# Patient Record
Sex: Male | Born: 1955 | Race: White | Hispanic: No | Smoking: Former smoker
Health system: Southern US, Community
[De-identification: ages and names within clinical notes are randomized; demographics above are authoritative.]

## PROBLEM LIST (undated history)

## (undated) DIAGNOSIS — M1990 Unspecified osteoarthritis, unspecified site: Secondary | ICD-10-CM

## (undated) DIAGNOSIS — K219 Gastro-esophageal reflux disease without esophagitis: Secondary | ICD-10-CM

## (undated) HISTORY — PX: OTHER SURGICAL HISTORY: SHX169

---

## 1999-09-22 ENCOUNTER — Other Ambulatory Visit (HOSPITAL_COMMUNITY): Admission: RE | Admit: 1999-09-22 | Discharge: 1999-10-13 | Payer: Self-pay | Admitting: Psychiatry

## 2003-11-27 ENCOUNTER — Ambulatory Visit (HOSPITAL_COMMUNITY): Admission: RE | Admit: 2003-11-27 | Discharge: 2003-11-27 | Payer: Self-pay | Admitting: Specialist

## 2004-01-30 ENCOUNTER — Ambulatory Visit (HOSPITAL_BASED_OUTPATIENT_CLINIC_OR_DEPARTMENT_OTHER): Admission: RE | Admit: 2004-01-30 | Discharge: 2004-01-30 | Payer: Self-pay | Admitting: Specialist

## 2005-07-21 ENCOUNTER — Ambulatory Visit (HOSPITAL_BASED_OUTPATIENT_CLINIC_OR_DEPARTMENT_OTHER): Admission: RE | Admit: 2005-07-21 | Discharge: 2005-07-21 | Payer: Self-pay | Admitting: Specialist

## 2014-11-19 ENCOUNTER — Other Ambulatory Visit: Payer: Self-pay | Admitting: Acute Care

## 2014-11-19 DIAGNOSIS — Z87891 Personal history of nicotine dependence: Secondary | ICD-10-CM

## 2014-11-22 ENCOUNTER — Ambulatory Visit (INDEPENDENT_AMBULATORY_CARE_PROVIDER_SITE_OTHER): Payer: Commercial Managed Care - HMO | Admitting: Acute Care

## 2014-11-22 ENCOUNTER — Ambulatory Visit
Admission: RE | Admit: 2014-11-22 | Discharge: 2014-11-22 | Disposition: A | Discharge status: Home / Self Care | Payer: Commercial Managed Care - HMO | Source: Ambulatory Visit | Attending: Acute Care | Admitting: Acute Care

## 2014-11-22 ENCOUNTER — Encounter: Payer: Self-pay | Admitting: Acute Care

## 2014-11-22 ENCOUNTER — Telehealth: Payer: Self-pay | Admitting: Acute Care

## 2014-11-22 DIAGNOSIS — Z87891 Personal history of nicotine dependence: Secondary | ICD-10-CM

## 2014-11-22 NOTE — Telephone Encounter (Signed)
I have called Mr. Bradley Harrell with the results of the low dose CT. I explained that it was read as a Lung RADs 2, nodules with a very low likelihood of becoming a clinically active cancer due to lack of size and growth. I explained that the recommendation is for a repeat scan in 12 months, and that we will call at the beginning of Oct. 2017 to schedule it for the end of Oct. 2017. I told him to call sooner if he notice blood in his sputum while coughing, or if he had any unexplained weight loss of >15 pounds in 6 months, as we would want to follow up sooner.He verbalized understanding of all of the above and had no further questions. He has my contact information in the event he has any further questions.I will fax a copy of the scan to Dr. Catha GosselinKevin Little, the patients PCP.

## 2014-11-22 NOTE — Progress Notes (Signed)
Shared Decision Making Visit Lung Cancer Screening Program [phone]([id number])   Eligibility:  Age 59 y.o.  Pack Years Smoking History Calculation 44 pack year history (# packs/per year x # years smoked)  Recent History of coughing up blood  no  Unexplained weight loss? no ( >Than 15 pounds within the last 6 months )  Prior History Lung / other cancer no (Diagnosis within the last 5 years already requiring surveillance chest CT Scans).  Smoking Status Former Smoker  Former Smokers: Years since quit: 6 years  Quit Date: 2010  Visit Components:  Discussion included one or more decision making aids. yes  Discussion included risk/benefits of screening. yes  Discussion included potential follow up diagnostic testing for abnormal scans. yes  Discussion included meaning and risk of over diagnosis. yes  Discussion included meaning and risk of False Positives. yes  Discussion included meaning of total radiation exposure. yes  Counseling Included:  Importance of adherence to annual lung cancer LDCT screening. yes  Impact of comorbidities on ability to participate in the program. yes  Ability and willingness to under diagnostic treatment. yes  Smoking Cessation Counseling:  Current Smokers: 9}  Discussed importance of smoking cessation. NA; former smoker  Information about tobacco cessation classes and interventions provided to patient. Former smoker  Patient provided with "ticket" for LDCT Scan. Former smoker  Symptomatic Patient. No  Counseling;NA  Diagnosis Code: Tobacco Use Z72.0  Asymptomatic Patient yes  CounselingNA  Former Smokers:   Discussed the importance of maintaining cigarette abstinence. yes  Diagnosis Code: Personal History of Nicotine Dependence. U04.540Z87.891  Information about tobacco cessation classes and interventions provided to patient. Yes  Patient provided with "ticket" for LDCT Scan. yes  Written Order for Lung Cancer Screening with LDCT placed  in Epic. Yes (CT Chest Lung Cancer Screening Low Dose W/O CM) [id number] Z12.2-Screening of respiratory organs Z87.891-Personal history of nicotine dependence  I spent 15 minutes of face to face time with Mr. Bradley Harrell discussing the risks and benefits of lung cancer screening. We viewed a power point that addressed the above noted topics, pausing at intervals to allow for questions to be asked and answered to ensure understanding. We discussed that by quitting smoking he took the single most powerful action he could to decrease his risk of lung cancer. He quit using Chantix 6 years ago and has no desire to smoke again. We discussed the time and location of his scan, and that I will call him within 12-24 hours of having the results to let him know the outcome. He verbalized understanding of all of the above.I gave him a copy of the power point we viewed together and my card and contact information in the event he has any additional questions in the future.    Bevelyn NgoSarah F Joseantonio Dittmar, NP

## 2015-04-16 ENCOUNTER — Other Ambulatory Visit: Payer: Self-pay | Admitting: Acute Care

## 2015-04-16 DIAGNOSIS — Z87891 Personal history of nicotine dependence: Secondary | ICD-10-CM

## 2015-11-25 ENCOUNTER — Ambulatory Visit
Admission: RE | Admit: 2015-11-25 | Discharge: 2015-11-25 | Disposition: A | Discharge status: Home / Self Care | Payer: Commercial Managed Care - HMO | Source: Ambulatory Visit | Attending: Acute Care | Admitting: Acute Care

## 2015-11-25 DIAGNOSIS — Z87891 Personal history of nicotine dependence: Secondary | ICD-10-CM

## 2015-11-27 ENCOUNTER — Other Ambulatory Visit: Payer: Self-pay | Admitting: Acute Care

## 2015-11-27 DIAGNOSIS — Z87891 Personal history of nicotine dependence: Secondary | ICD-10-CM

## 2016-02-22 DIAGNOSIS — J0100 Acute maxillary sinusitis, unspecified: Secondary | ICD-10-CM | POA: Diagnosis not present

## 2016-03-26 DIAGNOSIS — E782 Mixed hyperlipidemia: Secondary | ICD-10-CM | POA: Diagnosis not present

## 2016-03-26 DIAGNOSIS — Z0000 Encounter for general adult medical examination without abnormal findings: Secondary | ICD-10-CM | POA: Diagnosis not present

## 2016-05-06 DIAGNOSIS — Z23 Encounter for immunization: Secondary | ICD-10-CM | POA: Diagnosis not present

## 2016-09-02 DIAGNOSIS — Z79899 Other long term (current) drug therapy: Secondary | ICD-10-CM | POA: Diagnosis not present

## 2016-09-02 DIAGNOSIS — E782 Mixed hyperlipidemia: Secondary | ICD-10-CM | POA: Diagnosis not present

## 2016-09-23 DIAGNOSIS — Z23 Encounter for immunization: Secondary | ICD-10-CM | POA: Diagnosis not present

## 2016-11-25 ENCOUNTER — Ambulatory Visit
Admission: RE | Admit: 2016-11-25 | Discharge: 2016-11-25 | Disposition: A | Discharge status: Home / Self Care | Payer: 59 | Source: Ambulatory Visit | Attending: Acute Care | Admitting: Acute Care

## 2016-11-25 DIAGNOSIS — Z87891 Personal history of nicotine dependence: Secondary | ICD-10-CM | POA: Diagnosis not present

## 2016-12-01 ENCOUNTER — Other Ambulatory Visit: Payer: Self-pay | Admitting: Acute Care

## 2016-12-01 DIAGNOSIS — Z122 Encounter for screening for malignant neoplasm of respiratory organs: Secondary | ICD-10-CM

## 2016-12-01 DIAGNOSIS — Z87891 Personal history of nicotine dependence: Secondary | ICD-10-CM

## 2017-02-15 DIAGNOSIS — M1731 Unilateral post-traumatic osteoarthritis, right knee: Secondary | ICD-10-CM | POA: Diagnosis not present

## 2017-02-15 DIAGNOSIS — M25561 Pain in right knee: Secondary | ICD-10-CM | POA: Diagnosis not present

## 2017-03-06 DIAGNOSIS — J018 Other acute sinusitis: Secondary | ICD-10-CM | POA: Diagnosis not present

## 2017-03-24 DIAGNOSIS — M1711 Unilateral primary osteoarthritis, right knee: Secondary | ICD-10-CM | POA: Diagnosis not present

## 2017-03-31 DIAGNOSIS — M1711 Unilateral primary osteoarthritis, right knee: Secondary | ICD-10-CM | POA: Diagnosis not present

## 2017-04-07 DIAGNOSIS — M1711 Unilateral primary osteoarthritis, right knee: Secondary | ICD-10-CM | POA: Diagnosis not present

## 2017-05-21 DIAGNOSIS — Z0000 Encounter for general adult medical examination without abnormal findings: Secondary | ICD-10-CM | POA: Diagnosis not present

## 2017-05-21 DIAGNOSIS — E782 Mixed hyperlipidemia: Secondary | ICD-10-CM | POA: Diagnosis not present

## 2017-06-22 DIAGNOSIS — J329 Chronic sinusitis, unspecified: Secondary | ICD-10-CM | POA: Diagnosis not present

## 2017-09-18 DIAGNOSIS — J069 Acute upper respiratory infection, unspecified: Secondary | ICD-10-CM | POA: Diagnosis not present

## 2017-09-18 DIAGNOSIS — Z23 Encounter for immunization: Secondary | ICD-10-CM | POA: Diagnosis not present

## 2017-11-26 ENCOUNTER — Ambulatory Visit
Admission: RE | Admit: 2017-11-26 | Discharge: 2017-11-26 | Disposition: A | Discharge status: Home / Self Care | Payer: 59 | Source: Ambulatory Visit | Attending: Acute Care | Admitting: Acute Care

## 2017-11-26 DIAGNOSIS — Z87891 Personal history of nicotine dependence: Secondary | ICD-10-CM

## 2017-11-26 DIAGNOSIS — Z122 Encounter for screening for malignant neoplasm of respiratory organs: Secondary | ICD-10-CM

## 2017-12-03 ENCOUNTER — Other Ambulatory Visit: Payer: Self-pay | Admitting: Acute Care

## 2017-12-03 DIAGNOSIS — Z87891 Personal history of nicotine dependence: Secondary | ICD-10-CM

## 2017-12-03 DIAGNOSIS — Z122 Encounter for screening for malignant neoplasm of respiratory organs: Secondary | ICD-10-CM

## 2018-06-16 DIAGNOSIS — Z23 Encounter for immunization: Secondary | ICD-10-CM | POA: Diagnosis not present

## 2018-06-16 DIAGNOSIS — E782 Mixed hyperlipidemia: Secondary | ICD-10-CM | POA: Diagnosis not present

## 2018-06-16 DIAGNOSIS — Z0000 Encounter for general adult medical examination without abnormal findings: Secondary | ICD-10-CM | POA: Diagnosis not present

## 2018-12-01 ENCOUNTER — Ambulatory Visit
Admission: RE | Admit: 2018-12-01 | Discharge: 2018-12-01 | Disposition: A | Discharge status: Home / Self Care | Payer: 59 | Source: Ambulatory Visit | Attending: Acute Care | Admitting: Acute Care

## 2018-12-01 DIAGNOSIS — Z122 Encounter for screening for malignant neoplasm of respiratory organs: Secondary | ICD-10-CM

## 2018-12-01 DIAGNOSIS — Z87891 Personal history of nicotine dependence: Secondary | ICD-10-CM

## 2018-12-08 ENCOUNTER — Telehealth: Payer: Self-pay | Admitting: Acute Care

## 2018-12-08 DIAGNOSIS — Z122 Encounter for screening for malignant neoplasm of respiratory organs: Secondary | ICD-10-CM

## 2018-12-08 DIAGNOSIS — Z87891 Personal history of nicotine dependence: Secondary | ICD-10-CM

## 2018-12-08 NOTE — Telephone Encounter (Signed)
Pt informed of CT results per Sarah Groce, NP.  PT verbalized understanding.  Copy sent to PCP.  Order placed for 1 yr f/u CT.  

## 2019-09-08 ENCOUNTER — Other Ambulatory Visit: Payer: Self-pay | Admitting: Family Medicine

## 2019-09-08 DIAGNOSIS — Z136 Encounter for screening for cardiovascular disorders: Secondary | ICD-10-CM

## 2019-09-18 ENCOUNTER — Ambulatory Visit: Payer: 59

## 2019-09-19 ENCOUNTER — Ambulatory Visit: Payer: Self-pay | Admitting: Orthopedic Surgery

## 2019-09-25 ENCOUNTER — Ambulatory Visit
Admission: RE | Admit: 2019-09-25 | Discharge: 2019-09-25 | Disposition: A | Discharge status: Home / Self Care | Payer: Medicare Other | Source: Ambulatory Visit | Attending: Family Medicine | Admitting: Family Medicine

## 2019-09-25 DIAGNOSIS — Z136 Encounter for screening for cardiovascular disorders: Secondary | ICD-10-CM

## 2019-11-13 ENCOUNTER — Ambulatory Visit: Payer: Self-pay | Admitting: Orthopedic Surgery

## 2019-11-14 NOTE — Progress Notes (Signed)
DUE TO COVID-19 ONLY ONE VISITOR IS ALLOWED TO COME WITH YOU AND STAY IN THE WAITING ROOM ONLY DURING PRE OP AND PROCEDURE DAY OF SURGERY. THE 1 VISITOR  MAY VISIT WITH YOU AFTER SURGERY IN YOUR PRIVATE ROOM DURING VISITING HOURS ONLY!  YOU NEED TO HAVE A COVID 19 TEST ON___10/23/2021 ____ @_______ , THIS TEST MUST BE DONE BEFORE SURGERY,  COVID TESTING SITE [address] [place] , IT IS ON THE RIGHT GOING OUT [address] APPROXIMATELY  2 MINUTES PAST [employer] ON THE RIGHT. ONCE YOUR COVID TEST IS COMPLETED,  PLEASE BEGIN THE QUARANTINE INSTRUCTIONS AS OUTLINED IN YOUR HANDOUT.                Bradley Harrell  11/14/2019   Your procedure is scheduled on: 11/22/2019    Report to [facility] Main  Entrance   Report to admitting at   0530 AM     Call this number if you have problems the morning of surgery [phone]    REMEMBER: NO  SOLID FOOD CANDY OR GUM AFTER MIDNIGHT. CLEAR LIQUIDS UNTIL 0430 am         . NOTHING BY MOUTH EXCEPT CLEAR LIQUIDS UNTIL    . PLEASE FINISH ENSURE DRINK PER SURGEON ORDER  WHICH NEEDS TO BE COMPLETED AT   0430am   .      CLEAR LIQUID DIET   Foods Allowed                                                                    Coffee and tea, regular and decaf                            Fruit ices (not with fruit pulp)                                      Iced Popsicles                                    Carbonated beverages, regular and diet                                    Cranberry, grape and apple juices Sports drinks like Gatorade Lightly seasoned clear broth or consume(fat free) Sugar, honey syrup ___________________________________________________________________      BRUSH YOUR TEETH MORNING OF SURGERY AND RINSE YOUR MOUTH OUT, NO CHEWING GUM CANDY OR MINTS.     Take these medicines the morning of surgery with A SIP OF WATER: none                                  You may not have any metal on your  body including hair pins and              piercings  Do not wear jewelry, make-up, lotions, powders or perfumes, deodorant  Do not wear nail polish on your fingernails.  Do not shave  48 hours prior to surgery.              Men may shave face and neck.   Do not bring valuables to the hospital. [place].  Contacts, dentures or bridgework may not be worn into surgery.  Leave suitcase in the car. After surgery it may be brought to your room.     Patients discharged the day of surgery will not be allowed to drive home. IF YOU ARE HAVING SURGERY AND GOING HOME THE SAME DAY, YOU MUST HAVE AN ADULT TO DRIVE YOU HOME AND BE WITH YOU FOR 24 HOURS. YOU MAY GO HOME BY TAXI OR UBER OR ORTHERWISE, BUT AN ADULT MUST ACCOMPANY YOU HOME AND STAY WITH YOU FOR 24 HOURS.  Name and phone number of your driver:  Special Instructions: N/A              Please read over the following fact sheets you were given: _____________________________________________________________________  [facility] - Preparing for Surgery Before surgery, you can play an important role.  Because skin is not sterile, your skin needs to be as free of germs as possible.  You can reduce the number of germs on your skin by washing with CHG (chlorahexidine gluconate) soap before surgery.  CHG is an antiseptic cleaner which kills germs and bonds with the skin to continue killing germs even after washing. Please DO NOT use if you have an allergy to CHG or antibacterial soaps.  If your skin becomes reddened/irritated stop using the CHG and inform your nurse when you arrive at Short Stay. Do not shave (including legs and underarms) for at least 48 hours prior to the first CHG shower.  You may shave your face/neck. Please follow these instructions carefully:  1.  Shower with CHG Soap the night before surgery and the  morning of Surgery.  2.  If you choose to wash your hair, wash your hair  first as usual with your  normal  shampoo.  3.  After you shampoo, rinse your hair and body thoroughly to remove the  shampoo.                           4.  Use CHG as you would any other liquid soap.  You can apply chg directly  to the skin and wash                       Gently with a scrungie or clean washcloth.  5.  Apply the CHG Soap to your body ONLY FROM THE NECK DOWN.   Do not use on face/ open                           Wound or open sores. Avoid contact with eyes, ears mouth and genitals (private parts).                       Wash face,  Genitals (private parts) with your normal soap.             6.  Wash thoroughly, paying special attention to the area where your surgery  will be performed.  7.  Thoroughly rinse your body with  warm water from the neck down.  8.  DO NOT shower/wash with your normal soap after using and rinsing off  the CHG Soap.                9.  Pat yourself dry with a clean towel.            10.  Wear clean pajamas.            11.  Place clean sheets on your bed the night of your first shower and do not  sleep with pets. Day of Surgery : Do not apply any lotions/deodorants the morning of surgery.  Please wear clean clothes to the hospital/surgery center.  FAILURE TO FOLLOW THESE INSTRUCTIONS MAY RESULT IN THE CANCELLATION OF YOUR SURGERY PATIENT SIGNATURE_________________________________  NURSE SIGNATURE__________________________________  ________________________________________________________________________

## 2019-11-16 ENCOUNTER — Other Ambulatory Visit: Payer: Self-pay

## 2019-11-16 ENCOUNTER — Encounter (HOSPITAL_COMMUNITY)
Admission: RE | Admit: 2019-11-16 | Discharge: 2019-11-16 | Disposition: A | Discharge status: Home / Self Care | Payer: Medicare Other | Source: Ambulatory Visit | Attending: Specialist | Admitting: Specialist

## 2019-11-16 ENCOUNTER — Encounter (HOSPITAL_COMMUNITY): Payer: Self-pay

## 2019-11-16 ENCOUNTER — Ambulatory Visit: Payer: Self-pay | Admitting: Orthopedic Surgery

## 2019-11-16 DIAGNOSIS — Z01812 Encounter for preprocedural laboratory examination: Secondary | ICD-10-CM | POA: Diagnosis not present

## 2019-11-16 HISTORY — DX: Gastro-esophageal reflux disease without esophagitis: K21.9

## 2019-11-16 HISTORY — DX: Unspecified osteoarthritis, unspecified site: M19.90

## 2019-11-16 LAB — CBC
HCT: 43 % (ref 39.0–52.0)
Hemoglobin: 14.5 g/dL (ref 13.0–17.0)
MCH: 31 pg (ref 26.0–34.0)
MCHC: 33.7 g/dL (ref 30.0–36.0)
MCV: 91.9 fL (ref 80.0–100.0)
Platelets: 344 10*3/uL (ref 150–400)
RBC: 4.68 MIL/uL (ref 4.22–5.81)
RDW: 12.2 % (ref 11.5–15.5)
WBC: 8.8 10*3/uL (ref 4.0–10.5)
nRBC: 0 % (ref 0.0–0.2)

## 2019-11-16 LAB — SURGICAL PCR SCREEN
MRSA, PCR: NEGATIVE
Staphylococcus aureus: NEGATIVE

## 2019-11-16 LAB — URINALYSIS, ROUTINE W REFLEX MICROSCOPIC
Bilirubin Urine: NEGATIVE
Glucose, UA: NEGATIVE mg/dL
Ketones, ur: NEGATIVE mg/dL
Leukocytes,Ua: NEGATIVE
Nitrite: NEGATIVE
Protein, ur: NEGATIVE mg/dL
Specific Gravity, Urine: 1.01 (ref 1.005–1.030)
pH: 6.5 (ref 5.0–8.0)

## 2019-11-16 LAB — PROTIME-INR
INR: 0.9 (ref 0.8–1.2)
Prothrombin Time: 12.2 seconds (ref 11.4–15.2)

## 2019-11-16 LAB — BASIC METABOLIC PANEL
Anion gap: 11 (ref 5–15)
BUN: 12 mg/dL (ref 8–23)
CO2: 24 mmol/L (ref 22–32)
Calcium: 9.1 mg/dL (ref 8.9–10.3)
Chloride: 103 mmol/L (ref 98–111)
Creatinine, Ser: 0.89 mg/dL (ref 0.61–1.24)
GFR, Estimated: >60 mL/min (ref >60)
Glucose, Bld: 102 mg/dL — ABNORMAL HIGH (ref 70–99)
Potassium: 4.5 mmol/L (ref 3.5–5.1)
Sodium: 138 mmol/L (ref 135–145)

## 2019-11-16 LAB — APTT: aPTT: 30 seconds (ref 24–36)

## 2019-11-16 LAB — URINALYSIS, MICROSCOPIC (REFLEX)

## 2019-11-16 NOTE — Progress Notes (Addendum)
U/A done 11/16/19 routed via epic to Dr Shelle Iron.  Urine micro- routed via epic done 11/16/19.

## 2019-11-16 NOTE — H&P (View-Only) (Signed)
Bradley Harrell is an 64 y.o. male.   Chief Complaint: R knee pain HPI: Bradley Harrell is here today for his history and physical. He is scheduled for a right total knee arthroplasty on November 22, 2019 at [facility] by Dr. Jene Every.  Dr. Shelle Iron and the patient mutually agreed to proceed with a right total knee replacement. Risks and benefits of the procedure were discussed including stiffness, suboptimal range of motion, persistent pain, infection requiring removal of prosthesis and reinsertion, need for prophylactic antibiotics in the future, for example, dental procedures, possible need for manipulation, revision in the future and also anesthetic complications including DVT, PE, etc. We discussed the perioperative course, time in the hospital, postoperative recovery and the need for elevation to control swelling. We also discussed the predicted range of motion and the probability that squatting and kneeling would be unobtainable in the future. In addition, postoperative anticoagulation was discussed. We have obtained preoperative medical clearance as necessary from his PCP Dr. Clarene Duke. Provided illustrated handout and discussed it in detail. They will enroll in the total joint replacement educational forum at the hospital.  Past Medical History:  Diagnosis Date  . Arthritis   . GERD (gastroesophageal reflux disease)     Past Surgical History:  Procedure Laterality Date  . left knee arthroscopy     . right rotator cuff surgery       No family history on file. Social History:  reports that he has quit smoking. His smoking use included cigarettes. He has a 44.00 pack-year smoking history. He has never used smokeless tobacco. He reports that he does not drink alcohol and does not use drugs.  Allergies:  Allergies  Allergen Reactions  . Cefaclor Rash and Other (See Comments)    Skin irritation.   Medications: atorvastatin 20 mg tablet meloxicam 15 mg tablet Pennsaid 20  mg/gram/actuation (2 %) topical soln in metered-dose pump  Results for orders placed or performed during the hospital encounter of 11/16/19 (from the past 48 hour(s))  APTT     Status: None   Collection Time: 11/16/19 11:33 AM  Result Value Ref Range   aPTT 30 24 - 36 seconds    Comment: Performed at [facility], [address] [place], [place] [address]  CBC     Status: None   Collection Time: 11/16/19 11:33 AM  Result Value Ref Range   WBC 8.8 4.0 - 10.5 K/uL   RBC 4.68 4.22 - 5.81 MIL/uL   Hemoglobin 14.5 13.0 - 17.0 g/dL   HCT 45.6 39 - 52 %   MCV 91.9 80.0 - 100.0 fL   MCH 31.0 26.0 - 34.0 pg   MCHC 33.7 30.0 - 36.0 g/dL   RDW 25.6 38.9 - 37.3 %   Platelets 344 150 - 400 K/uL   nRBC 0.0 0.0 - 0.2 %    Comment: Performed at [facility], [address] [place], [place] [address]  Protime-INR     Status: None   Collection Time: 11/16/19 11:33 AM  Result Value Ref Range   Prothrombin Time 12.2 11.4 - 15.2 seconds   INR 0.9 0.8 - 1.2    Comment: (NOTE) INR goal varies based on device and disease states. Performed at [facility], [address], [place], [place] [address]    No results found.  Review of Systems  Constitutional: Negative.   HENT: Negative.   Eyes: Negative.   Respiratory: Negative.   Cardiovascular: Negative.  Gastrointestinal: Negative.   Endocrine: Negative.   Genitourinary: Negative.   Musculoskeletal: Positive for arthralgias and joint swelling.  Neurological: Negative.   Psychiatric/Behavioral: Negative.     There were no vitals taken for this visit. Physical Exam Constitutional:      Appearance: Normal appearance.  HENT:     Head: Normocephalic.     Right Ear: External ear normal.     Left Ear: External ear normal.     Nose: Nose normal.     Mouth/Throat:     Mouth: Mucous membranes are moist.  Cardiovascular:     Rate and Rhythm: Normal rate and regular rhythm.      Pulses: Normal pulses.  Pulmonary:     Effort: Pulmonary effort is normal.  Musculoskeletal:     Cervical back: Normal range of motion.     Comments: Patient is a 64 year old male.  Constitutional: General Appearance: healthy-appearing and NAD.  Gait and Station: Appearance: antalgic gait.  Cardiovascular System: Arterial Pulses Right: femoral normal, popliteal normal, dorsalis pedis normal, and posterior tibialis normal. Edema Right: no edema. Varicosities Right: no varicosities. Varicosities Left: no varicosities and capillary refill test normal.  Lymph Nodes: Inspection/Palpation Right: no inguinal LAD.  Knees: Inspection Right: no deformity. Bony Palpation Right: no tenderness of the inferior pole patella, the superior pole patella, the tibial tubercle, the lateral joint line, the medial tibial plateau, Gerdy's tubercle, or the neck of fibula and tenderness of the medial joint line. Soft Tissue Palpation Right: no tenderness of the quadriceps tendon, the prepatellar bursa, the patellar tendon, the medial collateral ligament, the saphenous nerve, the lateral collateral ligament, the infrapatellar tendon, or the common peroneal nerve. Active Range of Motion Right: normal. Stability Right: no laxity or ligamentous instability and anterior drawer sign negative, posterior drawer sign negative, and Lachman test negative. Special Tests Right: McMurray's test negative. Strength Right: no hamstring weakness or quadriceps weakness and flexion 5/5 and extension 5/5.  Skin: Right Lower Extremity: normal.  Neurologic: Ankle Reflex Right: normal (2). Knee Reflex Right: normal (2). Sensation on the Right: L2 normal, L3 normal, L4 normal, L5 normal, and S1 normal.  Psychiatric: Mood and Affect: active and alert and normal mood.  Skin:    General: Skin is warm and dry.  Neurological:     Mental Status: He is alert.      Assessment/Plan Impression: Right knee end-stage osteoarthritis refractory to  conservative treatment including injection therapy, quad strengthening, physical therapy, medication. Pain interfering with ADLs and quality of life at this point and patient desires to proceed with total knee replacement  Plan:Pt with end-stage right knee DJD, bone-on-bone, refractory to conservative tx, scheduled for 8 total knee replacement by Dr. Shelle Iron on October 27. We again discussed the procedure itself as well as risks, complications and alternatives, including but not limited to DVT, PE, infx, bleeding, failure of procedure, need for secondary procedure including manipulation, nerve injury, ongoing pain/symptoms, anesthesia risk, even stroke or death. Also discussed typical post-op protocols, activity restrictions, need for PT, flexion/extension exercises, time out of work. Discussed need for DVT ppx post-op per protocol. Discussed dental ppx and infx prevention. Also discussed limitations post-operatively such as kneeling and squatting. All questions were answered. Patient desires to proceed with surgery as scheduled.  Will hold supplements, ASA and NSAIDs accordingly. Will remain NPO after midnight the night before surgery. Will present to [facility] for pre-op testing. Given his rash and welts he experienced with Ceclor would recommend Vanco perioperatively. Anticipate hospital stay to  include at least 2 midnights given medical history and to ensure proper pain control. Plan aspirin for DVT ppx post-op. Plan oxycodone, Robaxin, Colace, Miralax. Plan home with HHPT post-op with family members at home for assistance then transition to outpatient PT. Will follow up 10-14 days post-op for staple removal and xrays.  Plan R total knee replacement  Dorothy Spark, PA-C for Dr. Shelle Iron 11/16/2019, 12:51 PM

## 2019-11-16 NOTE — H&P (Signed)
Bradley Harrell is an 64 y.o. male.   Chief Complaint: R knee pain HPI: Bradley Harrell is here today for his history and physical. He is scheduled for a right total knee arthroplasty on November 22, 2019 at [facility] by Dr. Jene Every.  Dr. Shelle Iron and the patient mutually agreed to proceed with a right total knee replacement. Risks and benefits of the procedure were discussed including stiffness, suboptimal range of motion, persistent pain, infection requiring removal of prosthesis and reinsertion, need for prophylactic antibiotics in the future, for example, dental procedures, possible need for manipulation, revision in the future and also anesthetic complications including DVT, PE, etc. We discussed the perioperative course, time in the hospital, postoperative recovery and the need for elevation to control swelling. We also discussed the predicted range of motion and the probability that squatting and kneeling would be unobtainable in the future. In addition, postoperative anticoagulation was discussed. We have obtained preoperative medical clearance as necessary from his PCP Dr. Clarene Duke. Provided illustrated handout and discussed it in detail. They will enroll in the total joint replacement educational forum at the hospital.  Past Medical History:  Diagnosis Date  . Arthritis   . GERD (gastroesophageal reflux disease)     Past Surgical History:  Procedure Laterality Date  . left knee arthroscopy     . right rotator cuff surgery       No family history on file. Social History:  reports that he has quit smoking. His smoking use included cigarettes. He has a 44.00 pack-year smoking history. He has never used smokeless tobacco. He reports that he does not drink alcohol and does not use drugs.  Allergies:  Allergies  Allergen Reactions  . Cefaclor Rash and Other (See Comments)    Skin irritation.   Medications: atorvastatin 20 mg tablet meloxicam 15 mg tablet Pennsaid 20  mg/gram/actuation (2 %) topical soln in metered-dose pump  Results for orders placed or performed during the hospital encounter of 11/16/19 (from the past 48 hour(s))  APTT     Status: None   Collection Time: 11/16/19 11:33 AM  Result Value Ref Range   aPTT 30 24 - 36 seconds    Comment: Performed at [facility], [address] [place], [place] [address]  CBC     Status: None   Collection Time: 11/16/19 11:33 AM  Result Value Ref Range   WBC 8.8 4.0 - 10.5 K/uL   RBC 4.68 4.22 - 5.81 MIL/uL   Hemoglobin 14.5 13.0 - 17.0 g/dL   HCT 45.6 39 - 52 %   MCV 91.9 80.0 - 100.0 fL   MCH 31.0 26.0 - 34.0 pg   MCHC 33.7 30.0 - 36.0 g/dL   RDW 25.6 38.9 - 37.3 %   Platelets 344 150 - 400 K/uL   nRBC 0.0 0.0 - 0.2 %    Comment: Performed at [facility], [address] [place], [place] [address]  Protime-INR     Status: None   Collection Time: 11/16/19 11:33 AM  Result Value Ref Range   Prothrombin Time 12.2 11.4 - 15.2 seconds   INR 0.9 0.8 - 1.2    Comment: (NOTE) INR goal varies based on device and disease states. Performed at [facility], [address], [place], [place] [address]    No results found.  Review of Systems  Constitutional: Negative.   HENT: Negative.   Eyes: Negative.   Respiratory: Negative.   Cardiovascular: Negative.  Gastrointestinal: Negative.   Endocrine: Negative.   Genitourinary: Negative.   Musculoskeletal: Positive for arthralgias and joint swelling.  Neurological: Negative.   Psychiatric/Behavioral: Negative.     There were no vitals taken for this visit. Physical Exam Constitutional:      Appearance: Normal appearance.  HENT:     Head: Normocephalic.     Right Ear: External ear normal.     Left Ear: External ear normal.     Nose: Nose normal.     Mouth/Throat:     Mouth: Mucous membranes are moist.  Cardiovascular:     Rate and Rhythm: Normal rate and regular rhythm.      Pulses: Normal pulses.  Pulmonary:     Effort: Pulmonary effort is normal.  Musculoskeletal:     Cervical back: Normal range of motion.     Comments: Patient is a 64 year old male.  Constitutional: General Appearance: healthy-appearing and NAD.  Gait and Station: Appearance: antalgic gait.  Cardiovascular System: Arterial Pulses Right: femoral normal, popliteal normal, dorsalis pedis normal, and posterior tibialis normal. Edema Right: no edema. Varicosities Right: no varicosities. Varicosities Left: no varicosities and capillary refill test normal.  Lymph Nodes: Inspection/Palpation Right: no inguinal LAD.  Knees: Inspection Right: no deformity. Bony Palpation Right: no tenderness of the inferior pole patella, the superior pole patella, the tibial tubercle, the lateral joint line, the medial tibial plateau, Gerdy's tubercle, or the neck of fibula and tenderness of the medial joint line. Soft Tissue Palpation Right: no tenderness of the quadriceps tendon, the prepatellar bursa, the patellar tendon, the medial collateral ligament, the saphenous nerve, the lateral collateral ligament, the infrapatellar tendon, or the common peroneal nerve. Active Range of Motion Right: normal. Stability Right: no laxity or ligamentous instability and anterior drawer sign negative, posterior drawer sign negative, and Lachman test negative. Special Tests Right: McMurray's test negative. Strength Right: no hamstring weakness or quadriceps weakness and flexion 5/5 and extension 5/5.  Skin: Right Lower Extremity: normal.  Neurologic: Ankle Reflex Right: normal (2). Knee Reflex Right: normal (2). Sensation on the Right: L2 normal, L3 normal, L4 normal, L5 normal, and S1 normal.  Psychiatric: Mood and Affect: active and alert and normal mood.  Skin:    General: Skin is warm and dry.  Neurological:     Mental Status: He is alert.      Assessment/Plan Impression: Right knee end-stage osteoarthritis refractory to  conservative treatment including injection therapy, quad strengthening, physical therapy, medication. Pain interfering with ADLs and quality of life at this point and patient desires to proceed with total knee replacement  Plan:Pt with end-stage right knee DJD, bone-on-bone, refractory to conservative tx, scheduled for 8 total knee replacement by Dr. Shelle Iron on October 27. We again discussed the procedure itself as well as risks, complications and alternatives, including but not limited to DVT, PE, infx, bleeding, failure of procedure, need for secondary procedure including manipulation, nerve injury, ongoing pain/symptoms, anesthesia risk, even stroke or death. Also discussed typical post-op protocols, activity restrictions, need for PT, flexion/extension exercises, time out of work. Discussed need for DVT ppx post-op per protocol. Discussed dental ppx and infx prevention. Also discussed limitations post-operatively such as kneeling and squatting. All questions were answered. Patient desires to proceed with surgery as scheduled.  Will hold supplements, ASA and NSAIDs accordingly. Will remain NPO after midnight the night before surgery. Will present to [facility] for pre-op testing. Given his rash and welts he experienced with Ceclor would recommend Vanco perioperatively. Anticipate hospital stay to  include at least 2 midnights given medical history and to ensure proper pain control. Plan aspirin for DVT ppx post-op. Plan oxycodone, Robaxin, Colace, Miralax. Plan home with HHPT post-op with family members at home for assistance then transition to outpatient PT. Will follow up 10-14 days post-op for staple removal and xrays.  Plan R total knee replacement  Dorothy Spark, PA-C for Dr. Shelle Iron 11/16/2019, 12:51 PM

## 2019-11-18 ENCOUNTER — Other Ambulatory Visit (HOSPITAL_COMMUNITY)
Admission: RE | Admit: 2019-11-18 | Discharge: 2019-11-18 | Disposition: A | Discharge status: Home / Self Care | Payer: Medicare Other | Source: Ambulatory Visit | Attending: Specialist | Admitting: Specialist

## 2019-11-18 DIAGNOSIS — Z01812 Encounter for preprocedural laboratory examination: Secondary | ICD-10-CM | POA: Diagnosis present

## 2019-11-18 DIAGNOSIS — Z20822 Contact with and (suspected) exposure to covid-19: Secondary | ICD-10-CM | POA: Insufficient documentation

## 2019-11-18 LAB — SARS CORONAVIRUS 2 (TAT 6-24 HRS): SARS Coronavirus 2: NEGATIVE

## 2019-11-21 MED ORDER — VANCOMYCIN HCL 1500 MG/300ML IV SOLN
1500.0000 mg | INTRAVENOUS | Status: AC
  Administered 2019-11-22: 1500 mg via INTRAVENOUS
  Filled 2019-11-21: qty 300

## 2019-11-22 ENCOUNTER — Encounter (HOSPITAL_COMMUNITY)
Admission: RE | Disposition: A | Discharge status: Home Health | Payer: Self-pay | Source: Home / Self Care | Attending: Specialist

## 2019-11-22 ENCOUNTER — Encounter (HOSPITAL_COMMUNITY): Payer: Self-pay | Admitting: Specialist

## 2019-11-22 ENCOUNTER — Inpatient Hospital Stay (HOSPITAL_COMMUNITY): Payer: Medicare Other | Admitting: Anesthesiology

## 2019-11-22 ENCOUNTER — Other Ambulatory Visit: Payer: Self-pay

## 2019-11-22 ENCOUNTER — Inpatient Hospital Stay (HOSPITAL_COMMUNITY)
Admission: RE | Admit: 2019-11-22 | Discharge: 2019-11-23 | DRG: 470 | Disposition: A | Discharge status: Home Health | Payer: Medicare Other | Attending: Specialist | Admitting: Specialist

## 2019-11-22 ENCOUNTER — Inpatient Hospital Stay (HOSPITAL_COMMUNITY): Payer: Medicare Other

## 2019-11-22 DIAGNOSIS — Z881 Allergy status to other antibiotic agents status: Secondary | ICD-10-CM | POA: Diagnosis not present

## 2019-11-22 DIAGNOSIS — Z87891 Personal history of nicotine dependence: Secondary | ICD-10-CM

## 2019-11-22 DIAGNOSIS — K219 Gastro-esophageal reflux disease without esophagitis: Secondary | ICD-10-CM | POA: Diagnosis present

## 2019-11-22 DIAGNOSIS — M1711 Unilateral primary osteoarthritis, right knee: Principal | ICD-10-CM | POA: Diagnosis present

## 2019-11-22 DIAGNOSIS — Z96659 Presence of unspecified artificial knee joint: Secondary | ICD-10-CM

## 2019-11-22 DIAGNOSIS — M21161 Varus deformity, not elsewhere classified, right knee: Secondary | ICD-10-CM | POA: Diagnosis present

## 2019-11-22 HISTORY — PX: TOTAL KNEE ARTHROPLASTY: SHX125

## 2019-11-22 SURGERY — ARTHROPLASTY, KNEE, TOTAL
Anesthesia: Monitor Anesthesia Care | Site: Knee | Laterality: Right

## 2019-11-22 MED ORDER — ONDANSETRON HCL 4 MG/2ML IJ SOLN: 4.0000 mg | Freq: Once | INTRAMUSCULAR | Status: DC | PRN

## 2019-11-22 MED ORDER — MIDAZOLAM HCL 2 MG/2ML IJ SOLN
INTRAMUSCULAR | Status: AC
  Filled 2019-11-22: qty 2

## 2019-11-22 MED ORDER — IRRISEPT - 450ML BOTTLE WITH 0.05% CHG IN STERILE WATER, USP 99.95% OPTIME
TOPICAL | Status: DC | PRN
  Administered 2019-11-22: 150 mL

## 2019-11-22 MED ORDER — DOCUSATE SODIUM 100 MG PO CAPS
100.0000 mg | ORAL_CAPSULE | Freq: Two times a day (BID) | ORAL | Status: DC
  Administered 2019-11-22 – 2019-11-23 (×2): 100 mg via ORAL
  Filled 2019-11-22 (×2): qty 1

## 2019-11-22 MED ORDER — SODIUM CHLORIDE 0.9 % IR SOLN
Status: DC | PRN
  Administered 2019-11-22: 1000 mL

## 2019-11-22 MED ORDER — PHENYLEPHRINE HCL-NACL 10-0.9 MG/250ML-% IV SOLN
INTRAVENOUS | Status: AC
  Filled 2019-11-22: qty 250

## 2019-11-22 MED ORDER — PHENOL 1.4 % MT LIQD: 1.0000 | OROMUCOSAL | Status: DC | PRN

## 2019-11-22 MED ORDER — PHENYLEPHRINE HCL-NACL 10-0.9 MG/250ML-% IV SOLN
INTRAVENOUS | Status: DC | PRN
  Administered 2019-11-22: 25 ug/min via INTRAVENOUS

## 2019-11-22 MED ORDER — ONDANSETRON HCL 4 MG/2ML IJ SOLN
INTRAMUSCULAR | Status: AC
  Filled 2019-11-22: qty 2

## 2019-11-22 MED ORDER — BUPIVACAINE IN DEXTROSE 0.75-8.25 % IT SOLN
INTRATHECAL | Status: DC | PRN
  Administered 2019-11-22: 2 mL via INTRATHECAL

## 2019-11-22 MED ORDER — HYDROMORPHONE HCL 1 MG/ML IJ SOLN: 0.2500 mg | INTRAMUSCULAR | Status: DC | PRN

## 2019-11-22 MED ORDER — DIPHENHYDRAMINE HCL 12.5 MG/5ML PO ELIX: 12.5000 mg | ORAL_SOLUTION | ORAL | Status: DC | PRN

## 2019-11-22 MED ORDER — ONDANSETRON HCL 4 MG/2ML IJ SOLN
INTRAMUSCULAR | Status: DC | PRN
  Administered 2019-11-22: 4 mg via INTRAVENOUS

## 2019-11-22 MED ORDER — HYDROMORPHONE HCL 1 MG/ML IJ SOLN: 1.0000 mg | INTRAMUSCULAR | Status: DC | PRN

## 2019-11-22 MED ORDER — ONDANSETRON HCL 4 MG PO TABS: 4.0000 mg | ORAL_TABLET | Freq: Four times a day (QID) | ORAL | Status: DC | PRN

## 2019-11-22 MED ORDER — KCL IN DEXTROSE-NACL 20-5-0.45 MEQ/L-%-% IV SOLN
INTRAVENOUS | Status: AC
  Filled 2019-11-22 (×2): qty 1000

## 2019-11-22 MED ORDER — BISACODYL 5 MG PO TBEC: 5.0000 mg | DELAYED_RELEASE_TABLET | Freq: Every day | ORAL | Status: DC | PRN

## 2019-11-22 MED ORDER — STERILE WATER FOR IRRIGATION IR SOLN
Status: DC | PRN
  Administered 2019-11-22: 2000 mL

## 2019-11-22 MED ORDER — FENTANYL CITRATE (PF) 100 MCG/2ML IJ SOLN
INTRAMUSCULAR | Status: DC | PRN
  Administered 2019-11-22: 50 ug via INTRAVENOUS
  Administered 2019-11-22 (×2): 25 ug via INTRAVENOUS

## 2019-11-22 MED ORDER — OXYCODONE HCL 5 MG/5ML PO SOLN: 5.0000 mg | Freq: Once | ORAL | Status: DC | PRN

## 2019-11-22 MED ORDER — LACTATED RINGERS IV SOLN: INTRAVENOUS | Status: DC

## 2019-11-22 MED ORDER — ASCORBIC ACID 500 MG PO TABS
500.0000 mg | ORAL_TABLET | Freq: Two times a day (BID) | ORAL | Status: DC
  Administered 2019-11-22 – 2019-11-23 (×2): 500 mg via ORAL
  Filled 2019-11-22 (×2): qty 1

## 2019-11-22 MED ORDER — PROPOFOL 10 MG/ML IV BOLUS
INTRAVENOUS | Status: DC | PRN
  Administered 2019-11-22: 20 mg via INTRAVENOUS
  Administered 2019-11-22: 10 mg via INTRAVENOUS

## 2019-11-22 MED ORDER — PROPOFOL 1000 MG/100ML IV EMUL
INTRAVENOUS | Status: AC
  Filled 2019-11-22: qty 100

## 2019-11-22 MED ORDER — ONDANSETRON HCL 4 MG/2ML IJ SOLN: 4.0000 mg | Freq: Four times a day (QID) | INTRAMUSCULAR | Status: DC | PRN

## 2019-11-22 MED ORDER — ROPIVACAINE HCL 5 MG/ML IJ SOLN
INTRAMUSCULAR | Status: DC | PRN
  Administered 2019-11-22: 30 mL via PERINEURAL

## 2019-11-22 MED ORDER — MIDAZOLAM HCL 5 MG/5ML IJ SOLN
INTRAMUSCULAR | Status: DC | PRN
  Administered 2019-11-22 (×2): 1 mg via INTRAVENOUS

## 2019-11-22 MED ORDER — LIDOCAINE 2% (20 MG/ML) 5 ML SYRINGE
INTRAMUSCULAR | Status: AC
  Filled 2019-11-22: qty 5

## 2019-11-22 MED ORDER — MENTHOL 3 MG MT LOZG: 1.0000 | LOZENGE | OROMUCOSAL | Status: DC | PRN

## 2019-11-22 MED ORDER — BUPIVACAINE-EPINEPHRINE 0.25% -1:200000 IJ SOLN
INTRAMUSCULAR | Status: AC
  Filled 2019-11-22: qty 1

## 2019-11-22 MED ORDER — 0.9 % SODIUM CHLORIDE (POUR BTL) OPTIME
TOPICAL | Status: DC | PRN
  Administered 2019-11-22: 1000 mL

## 2019-11-22 MED ORDER — DEXAMETHASONE SODIUM PHOSPHATE 10 MG/ML IJ SOLN
INTRAMUSCULAR | Status: AC
  Filled 2019-11-22: qty 1

## 2019-11-22 MED ORDER — VITAMIN B-12 1000 MCG PO TABS
1000.0000 ug | ORAL_TABLET | Freq: Every day | ORAL | Status: DC
  Administered 2019-11-22 – 2019-11-23 (×2): 1000 ug via ORAL
  Filled 2019-11-22 (×2): qty 1

## 2019-11-22 MED ORDER — MAGNESIUM CITRATE PO SOLN: 1.0000 | Freq: Once | ORAL | Status: DC | PRN

## 2019-11-22 MED ORDER — OMEGA-3-ACID ETHYL ESTERS 1 G PO CAPS
1.0000 g | ORAL_CAPSULE | Freq: Every day | ORAL | Status: DC
  Administered 2019-11-22 – 2019-11-23 (×2): 1 g via ORAL
  Filled 2019-11-22 (×2): qty 1

## 2019-11-22 MED ORDER — ACETAMINOPHEN 10 MG/ML IV SOLN
1000.0000 mg | INTRAVENOUS | Status: AC
  Administered 2019-11-22: 1000 mg via INTRAVENOUS
  Filled 2019-11-22: qty 100

## 2019-11-22 MED ORDER — CHLORHEXIDINE GLUCONATE CLOTH 2 % EX PADS: 6.0000 | MEDICATED_PAD | Freq: Every day | CUTANEOUS | Status: DC

## 2019-11-22 MED ORDER — ACETAMINOPHEN 10 MG/ML IV SOLN: 1000.0000 mg | Freq: Once | INTRAVENOUS | Status: DC | PRN

## 2019-11-22 MED ORDER — FENTANYL CITRATE (PF) 100 MCG/2ML IJ SOLN
INTRAMUSCULAR | Status: AC
  Filled 2019-11-22: qty 2

## 2019-11-22 MED ORDER — RISAQUAD PO CAPS
1.0000 | ORAL_CAPSULE | Freq: Every day | ORAL | Status: DC
  Administered 2019-11-22 – 2019-11-23 (×2): 1 via ORAL
  Filled 2019-11-22 (×2): qty 1

## 2019-11-22 MED ORDER — BUPIVACAINE-EPINEPHRINE 0.25% -1:200000 IJ SOLN
INTRAMUSCULAR | Status: DC | PRN
  Administered 2019-11-22: 50 mL

## 2019-11-22 MED ORDER — OXYCODONE HCL 5 MG PO TABS: 5.0000 mg | ORAL_TABLET | Freq: Once | ORAL | Status: DC | PRN

## 2019-11-22 MED ORDER — ALUM & MAG HYDROXIDE-SIMETH 200-200-20 MG/5ML PO SUSP: 30.0000 mL | ORAL | Status: DC | PRN

## 2019-11-22 MED ORDER — DEXAMETHASONE SODIUM PHOSPHATE 10 MG/ML IJ SOLN
INTRAMUSCULAR | Status: DC | PRN
  Administered 2019-11-22: 10 mg
  Administered 2019-11-22: 5 mg

## 2019-11-22 MED ORDER — METOCLOPRAMIDE HCL 5 MG/ML IJ SOLN: 5.0000 mg | Freq: Three times a day (TID) | INTRAMUSCULAR | Status: DC | PRN

## 2019-11-22 MED ORDER — ASPIRIN 81 MG PO CHEW
81.0000 mg | CHEWABLE_TABLET | Freq: Two times a day (BID) | ORAL | Status: DC
  Administered 2019-11-23: 81 mg via ORAL
  Filled 2019-11-22: qty 1

## 2019-11-22 MED ORDER — FLUTICASONE PROPIONATE 50 MCG/ACT NA SUSP
1.0000 | Freq: Every day | NASAL | Status: DC | PRN
  Filled 2019-11-22: qty 16

## 2019-11-22 MED ORDER — VANCOMYCIN HCL 1500 MG/300ML IV SOLN
1500.0000 mg | Freq: Two times a day (BID) | INTRAVENOUS | Status: AC
  Administered 2019-11-22: 1500 mg via INTRAVENOUS
  Filled 2019-11-22: qty 300

## 2019-11-22 MED ORDER — OXYCODONE HCL 5 MG PO TABS
5.0000 mg | ORAL_TABLET | ORAL | Status: DC | PRN
  Administered 2019-11-22: 10 mg via ORAL
  Administered 2019-11-22 (×2): 15 mg via ORAL
  Administered 2019-11-23 (×2): 10 mg via ORAL
  Filled 2019-11-22: qty 2
  Filled 2019-11-22: qty 3
  Filled 2019-11-22 (×2): qty 2
  Filled 2019-11-22: qty 3

## 2019-11-22 MED ORDER — PROPOFOL 500 MG/50ML IV EMUL
INTRAVENOUS | Status: DC | PRN
  Administered 2019-11-22: 75 ug/kg/min via INTRAVENOUS

## 2019-11-22 MED ORDER — TRANEXAMIC ACID-NACL 1000-0.7 MG/100ML-% IV SOLN
1000.0000 mg | INTRAVENOUS | Status: AC
  Administered 2019-11-22: 1000 mg via INTRAVENOUS
  Filled 2019-11-22: qty 100

## 2019-11-22 MED ORDER — POLYETHYLENE GLYCOL 3350 17 G PO PACK: 17.0000 g | PACK | Freq: Every day | ORAL | Status: DC | PRN

## 2019-11-22 MED ORDER — METOCLOPRAMIDE HCL 5 MG PO TABS: 5.0000 mg | ORAL_TABLET | Freq: Three times a day (TID) | ORAL | Status: DC | PRN

## 2019-11-22 MED ORDER — GENTAMICIN SULFATE 40 MG/ML IJ SOLN
120.0000 mg | Freq: Once | INTRAVENOUS | Status: AC
  Administered 2019-11-22: 120 mg via INTRAVENOUS
  Filled 2019-11-22: qty 3

## 2019-11-22 MED ORDER — GENTAMICIN IN SALINE 1.2-0.9 MG/ML-% IV SOLN
120.0000 mg | Freq: Once | INTRAVENOUS | Status: DC
  Filled 2019-11-22 (×2): qty 100

## 2019-11-22 MED ORDER — PROPOFOL 10 MG/ML IV BOLUS
INTRAVENOUS | Status: AC
  Filled 2019-11-22: qty 40

## 2019-11-22 SURGICAL SUPPLY — 81 items
AGENT HMST SPONGE THK3/8 (HEMOSTASIS) ×1
ATTUNE MED DOME PAT 38 KNEE (Knees) ×1 IMPLANT
ATTUNE PS FEM RT SZ 7 CEM KNEE (Femur) ×1 IMPLANT
ATTUNE PSRP INSR SZ7 7 KNEE (Insert) ×1 IMPLANT
BAG DECANTER FOR FLEXI CONT (MISCELLANEOUS) ×2 IMPLANT
BAG SPEC THK2 15X12 ZIP CLS (MISCELLANEOUS)
BAG ZIPLOCK 12X15 (MISCELLANEOUS) IMPLANT
BASE TIBIAL ROT PLAT SZ 7 KNEE (Knees) IMPLANT
BLADE SAG 18X100X1.27 (BLADE) ×2 IMPLANT
BLADE SAW SGTL 11.0X1.19X90.0M (BLADE) ×1 IMPLANT
BLADE SAW SGTL 13.0X1.19X90.0M (BLADE) ×2 IMPLANT
BLADE SURG SZ10 CARB STEEL (BLADE) ×4 IMPLANT
BNDG COHESIVE 4X5 TAN STRL (GAUZE/BANDAGES/DRESSINGS) ×2 IMPLANT
BNDG ELASTIC 4X5.8 VLCR STR LF (GAUZE/BANDAGES/DRESSINGS) ×2 IMPLANT
BNDG ELASTIC 6X5.8 VLCR STR LF (GAUZE/BANDAGES/DRESSINGS) ×2 IMPLANT
BSPLAT TIB 7 CMNT ROT PLAT STR (Knees) ×1 IMPLANT
CEMENT HV SMART SET (Cement) ×4 IMPLANT
COVER SURGICAL LIGHT HANDLE (MISCELLANEOUS) ×2 IMPLANT
COVER WAND RF STERILE (DRAPES) IMPLANT
CUFF TOURN SGL QUICK 34 (TOURNIQUET CUFF) ×2
CUFF TRNQT CYL 34X4.125X (TOURNIQUET CUFF) ×1 IMPLANT
DECANTER SPIKE VIAL GLASS SM (MISCELLANEOUS) ×2 IMPLANT
DRAPE INCISE IOBAN 66X45 STRL (DRAPES) IMPLANT
DRAPE ORTHO SPLIT 77X108 STRL (DRAPES) ×4
DRAPE SHEET LG 3/4 BI-LAMINATE (DRAPES) ×4 IMPLANT
DRAPE SURG ORHT 6 SPLT 77X108 (DRAPES) ×2 IMPLANT
DRAPE U-SHAPE 47X51 STRL (DRAPES) ×2 IMPLANT
DRSG AQUACEL AG ADV 3.5X10 (GAUZE/BANDAGES/DRESSINGS) ×1 IMPLANT
DRSG TEGADERM 4X4.75 (GAUZE/BANDAGES/DRESSINGS) IMPLANT
DURAPREP 26ML APPLICATOR (WOUND CARE) ×2 IMPLANT
ELECT BLADE TIP CTD 4 INCH (ELECTRODE) ×2 IMPLANT
ELECT REM PT RETURN 15FT ADLT (MISCELLANEOUS) ×2 IMPLANT
EVACUATOR 1/8 PVC DRAIN (DRAIN) IMPLANT
GAUZE SPONGE 2X2 8PLY STRL LF (GAUZE/BANDAGES/DRESSINGS) IMPLANT
GLOVE BIOGEL PI IND STRL 7.5 (GLOVE) ×1 IMPLANT
GLOVE BIOGEL PI IND STRL 8 (GLOVE) ×1 IMPLANT
GLOVE BIOGEL PI INDICATOR 7.5 (GLOVE) ×1
GLOVE BIOGEL PI INDICATOR 8 (GLOVE) ×1
GLOVE SURG SS PI 7.5 STRL IVOR (GLOVE) ×4 IMPLANT
GLOVE SURG SS PI 8.0 STRL IVOR (GLOVE) ×4 IMPLANT
GOWN STRL REUS W/TWL XL LVL3 (GOWN DISPOSABLE) ×4 IMPLANT
HANDPIECE INTERPULSE COAX TIP (DISPOSABLE) ×2
HEMOSTAT SPONGE AVITENE ULTRA (HEMOSTASIS) ×2 IMPLANT
HOLDER FOLEY CATH W/STRAP (MISCELLANEOUS) IMPLANT
IMMOBILIZER KNEE 20 (SOFTGOODS) ×2
IMMOBILIZER KNEE 20 THIGH 36 (SOFTGOODS) ×1 IMPLANT
JET LAVAGE IRRISEPT WOUND (IRRIGATION / IRRIGATOR) ×2
KIT TURNOVER KIT A (KITS) IMPLANT
LAVAGE JET IRRISEPT WOUND (IRRIGATION / IRRIGATOR) ×1 IMPLANT
MANIFOLD NEPTUNE II (INSTRUMENTS) ×2 IMPLANT
NDL SAFETY ECLIPSE 18X1.5 (NEEDLE) IMPLANT
NEEDLE HYPO 18GX1.5 SHARP (NEEDLE)
NS IRRIG 1000ML POUR BTL (IV SOLUTION) IMPLANT
PACK TOTAL KNEE CUSTOM (KITS) ×2 IMPLANT
PIN FIX SIGMA LCS THRD HI (PIN) ×1 IMPLANT
PIN STEINMAN FIXATION KNEE (PIN) ×1 IMPLANT
PROTECTOR NERVE ULNAR (MISCELLANEOUS) ×2 IMPLANT
SEALER BIPOLAR AQUA 6.0 (INSTRUMENTS) IMPLANT
SET HNDPC FAN SPRY TIP SCT (DISPOSABLE) ×1 IMPLANT
SPONGE GAUZE 2X2 STER 10/PKG (GAUZE/BANDAGES/DRESSINGS)
SPONGE SURGIFOAM ABS GEL 100 (HEMOSTASIS) IMPLANT
STAPLER VISISTAT (STAPLE) ×1 IMPLANT
STRIP CLOSURE SKIN 1/2X4 (GAUZE/BANDAGES/DRESSINGS) IMPLANT
SUT BONE WAX W31G (SUTURE) IMPLANT
SUT MNCRL AB 4-0 PS2 18 (SUTURE) IMPLANT
SUT STRATAFIX 0 PDS 27 VIOLET (SUTURE) ×2
SUT VIC AB 1 CT1 27 (SUTURE) ×4
SUT VIC AB 1 CT1 27XBRD ANTBC (SUTURE) ×2 IMPLANT
SUT VIC AB 1 CTX 36 (SUTURE)
SUT VIC AB 1 CTX36XBRD ANBCTR (SUTURE) IMPLANT
SUT VIC AB 2-0 CT1 27 (SUTURE) ×6
SUT VIC AB 2-0 CT1 TAPERPNT 27 (SUTURE) ×3 IMPLANT
SUTURE STRATFX 0 PDS 27 VIOLET (SUTURE) ×1 IMPLANT
SYR 3ML LL SCALE MARK (SYRINGE) IMPLANT
SYR 50ML LL SCALE MARK (SYRINGE) IMPLANT
TIBIAL BASE ROT PLAT SZ 7 KNEE (Knees) ×2 IMPLANT
TOWER CARTRIDGE SMART MIX (DISPOSABLE) ×2 IMPLANT
TRAY FOLEY MTR SLVR 16FR STAT (SET/KITS/TRAYS/PACK) ×2 IMPLANT
WATER STERILE IRR 1000ML POUR (IV SOLUTION) ×2 IMPLANT
WIPE CHG CHLORHEXIDINE 2% (PERSONAL CARE ITEMS) ×2 IMPLANT
WRAP KNEE MAXI GEL POST OP (GAUZE/BANDAGES/DRESSINGS) ×2 IMPLANT

## 2019-11-22 NOTE — Op Note (Signed)
NAME: Bradley Harrell, Bradley Harrell MEDICAL RECORD ZO:[id number] ACCOUNT [id number] DATE OF BIRTH:03/29/1955 FACILITY: [facility] LOCATION: [id number] PHYSICIAN:Veda Arrellano Connye Burkitt, MD  OPERATIVE REPORT  DATE OF PROCEDURE:  11/22/2019  PREOPERATIVE DIAGNOSIS:  End-stage osteoarthritis, varus deformity, right knee.  POSTOPERATIVE DIAGNOSIS:  End-stage osteoarthritis, varus deformity, right knee.  PROCEDURE PERFORMED:  Right total knee arthroplasty utilizing DePuy Attune rotating platform 7 femur, 7 tibia, 7 insert, 38 patella.  ANESTHESIA:  Spinal.  ASSISTANT:  Andrez Grime, PA  HISTORY:  A 64 year old with end-stage osteoarthrosis, medial compartment of the right knee, refractory to conservative treatment, indicated for replacement of the degenerative joint.  Risks and benefits discussed including bleeding, infection, damage to  neurovascular structures, no change in symptoms, worsening symptoms, DVT, PE, anesthetic complications, arthrofibrosis, etc.  TECHNIQUE:  With the patient in supine position, after induction of adequate spinal anesthesia a 2 grams Kefzol and 1200 of gentamicin the right lower extremity was prepped, draped, and exsanguinated in usual sterile fashion.  Thigh tourniquet inflated  to 250 mmHg.  Midline incision was then made over the patella.  Full thickness flaps developed.  Median parapatellar arthrotomy performed.  Soft tissue elevated medially, preserving the MCL attachment.  Patella everted, knee was flexed, end-stage  osteoarthrosis, medial compartment of the patellofemoral joint was noted.  Bare rongeur was utilized to remove osteophytes.  Remnants of medial and lateral menisci and the ACL were removed as well.  Fat pad was debrided.  Step drill was then utilized to  enter the femoral canal and was irrigated and a 5-degree right 9 off the distal femur was selected.  This was pinned and we performed a distal femoral cut, protecting soft tissues at all times.  Next, we sized the distal  femur off the anterior cortex to  a 7.  This was in 3 degrees of external rotation.  This was pinned.  We used a 7 block, pinned the block.  We performed anterior and posterior chamfer cuts with soft tissues protected at all times.  Next subluxed the tibia.  External alignment guide 2  off the defect, which was posteromedially.  Parallel with the shaft bisecting the tibiotalar joint in line with the first and second ray.  Three degrees of slope.  I performed the proximal tibial cut.  Protecting the soft tissues at all times.  I then  checked the flexion and extension gaps and they were equivalent.  We turned back to the tibia, subluxed the tibia, protecting the tendon sized it to a 7 just to the medial aspect of the tibial tuberosity.  We then pinned our baseplate.  I harvested bone  graft from the tibial canal and placed in the femoral canal.  We then drilled centrally and used our punch guide.  We removed the pins, turned attention back to the femur.  We performed our box cut after applying the femoral box cut guide.  Bisecting the  canal slightly lateral to that.  This was pinned and then performed our box cut.  I then removed that and placed a trial femur with a 6 mm insert.  I reduced it and full flexion and full extension, slight recurvatum.  No instability.  I felt a 7 would  be satisfactory.  I then everted the patella, clamped it, measured it to a 25, plane it to a 16 with an oscillating saw.  I then used a template patellar paddle.  38 parallel to the joint line, drilled our peg holes.  I placed a trial 38 patella, reduced  it and had excellent patellofemoral tracking.  All instrumentation was then removed.  We checked posteriorly, further remnants of the menisci were removed and cauterized geniculates.  I then copiously irrigated with antibiotic irrigation with pulsatile  lavage.  I then flexed the knee, subluxed the tibia, dried all surfaces thoroughly.  Mixed cement on the back table in  appropriate fashion, injected it into the tibial canal, digitally pressurizing the tibia.  Cement was on the tibial tray and I then  impacted the tibial tray.  Redundant cement removed.  I cemented and impacted the femur.  Redundant cement removed.  The patient in a trial 7 femur and reduced it, held it in extension with an axial load applied through the curing of the cement.  I  everted and cemented the patella and clamped it.  I then placed a 25% Marcaine with epinephrine into the joint, covered the joint while the cement was curing.  Following curing of the cement, we let down the tourniquet 59 minutes.  Minimal bleeding,  which was cauterized.  I then flexed the knee and meticulously removed redundant cement with osteotome.  Copiously irrigated the knee.  Then, I used antibiotic irrigation and placed a 7 mm insert, reduced it, had full extension, full flexion, good  stability with varus valgus stressing at 0 and 30 degrees.  Negative anterior drawer.  I then flexed the knee in slight flexion, reapproximated the patellar arthrotomy with #1 Vicryl interrupted figure-of-eight sutures and then oversewn with a running  Stratafix.  Following this, flexion to gravity at 90 degrees.  Full flexion and full extension.  Good patellofemoral tracking.  Next, we copiously irrigated once again subcutaneous with 2-0 and skin with Monocryl.  Sterile dressing applied, placed in  immobilizer and transferred to the hospital bed and transferred to the recovery room in satisfactory condition.  The patient tolerated the procedure well.  No complications.  BLOOD LOSS:  50 mL.  HN/NUANCE  D:11/22/2019 T:11/22/2019 JOB:[id number]

## 2019-11-22 NOTE — Discharge Instructions (Signed)
Elevate leg above heart 6x a day for 20minutes each Use knee immobilizer while walking until can SLR x 10 Use knee immobilizer in bed to keep knee in extension Aquacel dressing may remain in place until follow up. May shower with aquacel dressing in place. If the dressing becomes saturated or peels off, you may remove aquacel dressing. Do not remove steri-strips if they are present. Place new dressing with gauze and tape or ACE bandage which should be kept clean and dry and changed daily.  INSTRUCTIONS AFTER JOINT REPLACEMENT   o Remove items at home which could result in a fall. This includes throw rugs or furniture in walking pathways o ICE to the affected joint every three hours while awake for 30 minutes at a time, for at least the first 3-5 days, and then as needed for pain and swelling.  Continue to use ice for pain and swelling. You may notice swelling that will progress down to the foot and ankle.  This is normal after surgery.  Elevate your leg when you are not up walking on it.   o Continue to use the breathing machine you got in the hospital (incentive spirometer) which will help keep your temperature down.  It is common for your temperature to cycle up and down following surgery, especially at night when you are not up moving around and exerting yourself.  The breathing machine keeps your lungs expanded and your temperature down.   DIET:  As you were doing prior to hospitalization, we recommend a well-balanced diet.  DRESSING / WOUND CARE / SHOWERING  Keep the surgical dressing until follow up.  The dressing is water proof, so you can shower without any extra covering.  IF THE DRESSING FALLS OFF or the wound gets wet inside, change the dressing with sterile gauze.  Please use good hand washing techniques before changing the dressing.  Do not use any lotions or creams on the incision until instructed by your surgeon.    ACTIVITY  o Increase activity slowly as tolerated, but follow the  weight bearing instructions below.   o No driving for 6 weeks or until further direction given by your physician.  You cannot drive while taking narcotics.  o No lifting or carrying greater than 10 lbs. until further directed by your surgeon. o Avoid periods of inactivity such as sitting longer than an hour when not asleep. This helps prevent blood clots.  o You may return to work once you are authorized by your doctor.     WEIGHT BEARING   Weight bearing as tolerated with assist device (walker, cane, etc) as directed, use it as long as suggested by your surgeon or therapist, typically at least 4-6 weeks.   EXERCISES  Results after joint replacement surgery are often greatly improved when you follow the exercise, range of motion and muscle strengthening exercises prescribed by your doctor. Safety measures are also important to protect the joint from further injury. Any time any of these exercises cause you to have increased pain or swelling, decrease what you are doing until you are comfortable again and then slowly increase them. If you have problems or questions, call your caregiver or physical therapist for advice.   Rehabilitation is important following a joint replacement. After just a few days of immobilization, the muscles of the leg can become weakened and shrink (atrophy).  These exercises are designed to build up the tone and strength of the thigh and leg muscles and to improve motion. Often   times heat used for twenty to thirty minutes before working out will loosen up your tissues and help with improving the range of motion but do not use heat for the first two weeks following surgery (sometimes heat can increase post-operative swelling).   These exercises can be done on a training (exercise) mat, on the floor, on a table or on a bed. Use whatever works the best and is most comfortable for you.    Use music or television while you are exercising so that the exercises are a pleasant  break in your day. This will make your life better with the exercises acting as a break in your routine that you can look forward to.   Perform all exercises about fifteen times, three times per day or as directed.  You should exercise both the operative leg and the other leg as well.  Exercises include:   . Quad Sets - Tighten up the muscle on the front of the thigh (Quad) and hold for 5-10 seconds.   . Straight Leg Raises - With your knee straight (if you were given a brace, keep it on), lift the leg to 60 degrees, hold for 3 seconds, and slowly lower the leg.  Perform this exercise against resistance later as your leg gets stronger.  . Leg Slides: Lying on your back, slowly slide your foot toward your buttocks, bending your knee up off the floor (only go as far as is comfortable). Then slowly slide your foot back down until your leg is flat on the floor again.  . Angel Wings: Lying on your back spread your legs to the side as far apart as you can without causing discomfort.  . Hamstring Strength:  Lying on your back, push your heel against the floor with your leg straight by tightening up the muscles of your buttocks.  Repeat, but this time bend your knee to a comfortable angle, and push your heel against the floor.  You may put a pillow under the heel to make it more comfortable if necessary.   A rehabilitation program following joint replacement surgery can speed recovery and prevent re-injury in the future due to weakened muscles. Contact your doctor or a physical therapist for more information on knee rehabilitation.    CONSTIPATION  Constipation is defined medically as fewer than three stools per week and severe constipation as less than one stool per week.  Even if you have a regular bowel pattern at home, your normal regimen is likely to be disrupted due to multiple reasons following surgery.  Combination of anesthesia, postoperative narcotics, change in appetite and fluid intake all can  affect your bowels.   YOU MUST use at least one of the following options; they are listed in order of increasing strength to get the job done.  They are all available over the counter, and you may need to use some, POSSIBLY even all of these options:    Drink plenty of fluids (prune juice may be helpful) and high fiber foods Colace 100 mg by mouth twice a day  Senokot for constipation as directed and as needed Dulcolax (bisacodyl), take with full glass of water  Miralax (polyethylene glycol) once or twice a day as needed.  If you have tried all these things and are unable to have a bowel movement in the first 3-4 days after surgery call either your surgeon or your primary doctor.    If you experience loose stools or diarrhea, hold the medications until you stool   forms back up.  If your symptoms do not get better within 1 week or if they get worse, check with your doctor.  If you experience "the worst abdominal pain ever" or develop nausea or vomiting, please contact the office immediately for further recommendations for treatment.   ITCHING:  If you experience itching with your medications, try taking only a single pain pill, or even half a pain pill at a time.  You can also use Benadryl over the counter for itching or also to help with sleep.   TED HOSE STOCKINGS:  Use stockings on both legs until for at least 2 weeks or as directed by physician office. They may be removed at night for sleeping.  MEDICATIONS:  See your medication summary on the "After Visit Summary" that nursing will review with you.  You may have some home medications which will be placed on hold until you complete the course of blood thinner medication.  It is important for you to complete the blood thinner medication as prescribed.  PRECAUTIONS:  If you experience chest pain or shortness of breath - call 911 immediately for transfer to the hospital emergency department.   If you develop a fever greater that 101 F, purulent  drainage from wound, increased redness or drainage from wound, foul odor from the wound/dressing, or calf pain - CONTACT YOUR SURGEON.                                                   FOLLOW-UP APPOINTMENTS:  If you do not already have a post-op appointment, please call the office for an appointment to be seen by your surgeon.  Guidelines for how soon to be seen are listed in your "After Visit Summary", but are typically between 1-4 weeks after surgery.  OTHER INSTRUCTIONS:   Knee Replacement:  Do not place pillow under knee, focus on keeping the knee straight while resting. CPM instructions: 0-90 degrees, 2 hours in the morning, 2 hours in the afternoon, and 2 hours in the evening. Place foam block, curve side up under heel at all times except when in CPM or when walking.  DO NOT modify, tear, cut, or change the foam block in any way.   DENTAL ANTIBIOTICS:  In most cases prophylactic antibiotics for Dental procdeures after total joint surgery are not necessary.  Exceptions are as follows:  1. History of prior total joint infection  2. Severely immunocompromised (Organ Transplant, cancer chemotherapy, Rheumatoid biologic meds such as Humera)  3. Poorly controlled diabetes (A1C &gt; 8.0, blood glucose over 200)  If you have one of these conditions, contact your surgeon for an antibiotic prescription, prior to your dental procedure.   MAKE SURE YOU:  . Understand these instructions.  . Get help right away if you are not doing well or get worse.    Thank you for letting us be a part of your medical care team.  It is a privilege we respect greatly.  We hope these instructions will help you stay on track for a fast and full recovery!   

## 2019-11-22 NOTE — Brief Op Note (Signed)
11/22/2019  9:27 AM  PATIENT:  Bradley Harrell  64 y.o. male  PRE-OPERATIVE DIAGNOSIS:  Right knee degenerative joint disease  POST-OPERATIVE DIAGNOSIS:  Right knee degenerative joint disease  PROCEDURE:  Procedure(s) with comments: TOTAL KNEE ARTHROPLASTY (Right) - 2.5 hrs  SURGEON:  Surgeon(s) and Role:    Jene Every, MD - Primary  PHYSICIAN ASSISTANT:   ASSISTANTS: Bissell   ANESTHESIA:   spinal  EBL:  50 mL   BLOOD ADMINISTERED:none  DRAINS: none   LOCAL MEDICATIONS USED:  MARCAINE     SPECIMEN:  No Specimen  DISPOSITION OF SPECIMEN:  N/A  COUNTS:  YES  TOURNIQUET:   Total Tourniquet Time Documented: Thigh (Right) - 59 minutes Total: Thigh (Right) - 59 minutes   DICTATION: .Other Dictation: Dictation Number  [id number]  PLAN OF CARE: Admit to inpatient   PATIENT DISPOSITION:  PACU - hemodynamically stable.   Delay start of Pharmacological VTE agent (>24hrs) due to surgical blood loss or risk of bleeding: no

## 2019-11-22 NOTE — Transfer of Care (Signed)
Immediate Anesthesia Transfer of Care Note  Patient: Bradley Harrell  Procedure(s) Performed: TOTAL KNEE ARTHROPLASTY (Right Knee)  Patient Location: PACU  Anesthesia Type:Spinal  Level of Consciousness: awake, alert  and oriented  Airway & Oxygen Therapy: Patient Spontanous Breathing and Patient connected to face mask oxygen  Post-op Assessment: Report given to RN  Post vital signs: Reviewed and stable  Last Vitals:  Vitals Value Taken Time  BP 112/76 11/22/19 0936  Temp    Pulse 83 11/22/19 0940  Resp 24 11/22/19 0940  SpO2 99 % 11/22/19 0940  Vitals shown include unvalidated device data.  Last Pain:  Vitals:   11/22/19 0610  TempSrc:   PainSc: 0-No pain         Complications: No complications documented.

## 2019-11-22 NOTE — Anesthesia Procedure Notes (Signed)
Spinal  Patient location during procedure: OR Start time: 11/22/2019 7:15 AM End time: 11/22/2019 7:30 AM Staffing Performed: resident/CRNA  Resident/CRNA: Lavina Hamman, CRNA Preanesthetic Checklist Completed: patient identified, IV checked, site marked, risks and benefits discussed, surgical consent, monitors and equipment checked, pre-op evaluation and timeout performed Spinal Block Patient position: sitting Prep: DuraPrep Patient monitoring: heart rate, cardiac monitor, continuous pulse ox and blood pressure Approach: midline Location: L3-4 Injection technique: single-shot Needle Needle type: Sprotte  Needle gauge: 24 G Needle length: 9 cm Needle insertion depth: 6 cm Assessment Sensory level: T6 Additional Notes IV functioning, monitors applied to pt. Expiration date of kit checked and confirmed to be in date. Sterile prep and drape, hand hygiene and sterile gloved used. Pt was positioned and spine was prepped in sterile fashion. Skin was anesthetized with lidocaine. Free flow of clear CSF obtained prior to injecting local anesthetic into CSF x 1 attempt. Spinal needle aspirated freely following injection. Needle was carefully withdrawn, and pt tolerated procedure well. Loss of motor and sensory on exam post injection.

## 2019-11-22 NOTE — Interval H&P Note (Signed)
History and Physical Interval Note:  11/22/2019 7:08 AM  Bradley Harrell  has presented today for surgery, with the diagnosis of Right knee degenerative joint disease.  The various methods of treatment have been discussed with the patient and family. After consideration of risks, benefits and other options for treatment, the patient has consented to  Procedure(s) with comments: TOTAL KNEE ARTHROPLASTY (Right) - 2.5 hrs as a surgical intervention.  The patient's history has been reviewed, patient examined, no change in status, stable for surgery.  I have reviewed the patient's chart and labs.  Questions were answered to the patient's satisfaction.   Urine trace heme neg on UA per PCP. Seen Urology in past.   Javier Docker

## 2019-11-22 NOTE — Anesthesia Procedure Notes (Signed)
Anesthesia Regional Block: Adductor canal block   Pre-Anesthetic Checklist: ,, timeout performed, Correct Patient, Correct Site, Correct Laterality, Correct Procedure, Correct Position, site marked, Risks and benefits discussed,  Surgical consent,  Pre-op evaluation,  At surgeon's request and post-op pain management  Laterality: Right  Prep: Dura Prep       Needles:  Injection technique: Single-shot  Needle Type: Echogenic Stimulator Needle     Needle Length: 9cm  Needle Gauge: 20     Additional Needles:   Procedures:,,,, ultrasound used (permanent image in chart),,,,  Narrative:  Start time: 11/22/2019 6:45 AM End time: 11/22/2019 7:00 AM Injection made incrementally with aspirations every 5 mL.  Performed by: Personally  Anesthesiologist: Atilano Median, DO  Additional Notes: Patient identified. Risks/Benefits/Options discussed with patient including but not limited to bleeding, infection, nerve damage, failed block, incomplete pain control. Patient expressed understanding and wished to proceed. All questions were answered. Sterile technique was used throughout the entire procedure. Please see nursing notes for vital signs. Aspirated in 5cc intervals with injection for negative confirmation. Patient was given instructions on fall risk and not to get out of bed. All questions and concerns addressed with instructions to call with any issues or inadequate analgesia.

## 2019-11-22 NOTE — Anesthesia Preprocedure Evaluation (Addendum)
Anesthesia Evaluation  Patient identified by MRN, date of birth, ID band Patient awake    Reviewed: Patient's Chart, lab work & pertinent test results  Airway Mallampati: II  TM Distance: >3 FB Neck ROM: Full    Dental  (+) Teeth Intact   Pulmonary neg pulmonary ROS, former smoker,    Pulmonary exam normal        Cardiovascular negative cardio ROS   Rhythm:Regular Rate:Normal     Neuro/Psych negative neurological ROS     GI/Hepatic Neg liver ROS, GERD  ,  Endo/Other  negative endocrine ROS  Renal/GU negative Renal ROS  negative genitourinary   Musculoskeletal  (+) Arthritis ,   Abdominal (+)  Abdomen: soft. Bowel sounds: normal.  Peds  Hematology negative hematology ROS (+)   Anesthesia Other Findings   Reproductive/Obstetrics                            Anesthesia Physical Anesthesia Plan  ASA: II  Anesthesia Plan: Spinal, Regional and MAC   Post-op Pain Management:  Regional for Post-op pain   Induction:   PONV Risk Score and Plan: Ondansetron, Dexamethasone, Propofol infusion, Midazolam and Treatment may vary due to age or medical condition  Airway Management Planned: Simple Face Mask and Nasal Cannula  Additional Equipment: None  Intra-op Plan:   Post-operative Plan:   Informed Consent: I have reviewed the patients History and Physical, chart, labs and discussed the procedure including the risks, benefits and alternatives for the proposed anesthesia with the patient or authorized representative who has indicated his/her understanding and acceptance.     Dental advisory given  Plan Discussed with: CRNA  Anesthesia Plan Comments: (Lab Results      Component                Value               Date                      WBC                      8.8                 11/16/2019                HGB                      14.5                11/16/2019                HCT                       43.0                11/16/2019                MCV                      91.9                11/16/2019                PLT                      344  11/16/2019          )        Anesthesia Quick Evaluation

## 2019-11-22 NOTE — Anesthesia Postprocedure Evaluation (Signed)
Anesthesia Post Note  Patient: Bradley Harrell  Procedure(s) Performed: TOTAL KNEE ARTHROPLASTY (Right Knee)     Patient location during evaluation: PACU Anesthesia Type: Spinal and MAC Level of consciousness: oriented and awake and alert Pain management: pain level controlled Vital Signs Assessment: post-procedure vital signs reviewed and stable Respiratory status: spontaneous breathing, respiratory function stable and patient connected to nasal cannula oxygen Cardiovascular status: blood pressure returned to baseline and stable Postop Assessment: no headache, no backache and no apparent nausea or vomiting Anesthetic complications: no   No complications documented.  Last Vitals:  Vitals:   11/22/19 1045 11/22/19 1109  BP: 136/82 133/88  Pulse: 72 70  Resp: 18 16  Temp:  (!) 36.3 C  SpO2: 99% 99%    Last Pain:  Vitals:   11/22/19 1109  TempSrc: Oral  PainSc:                  March Rummage Brieanne Mignone

## 2019-11-22 NOTE — Evaluation (Signed)
Physical Therapy Evaluation Patient Details Name: Bradley Harrell MRN: [id number] DOB: March 11, 1955 Today's Date: 11/22/2019   History of Present Illness  Patient is 64 y.o. male s/p Rt TKA on 11/22/19 with PMH significant for OA, GERD., Rt RCR, and Lt knee scope.   Clinical Impression  BRYDON SPAHR is a 64 y.o. male POD 0 s/p Rt TKA. Patient reports independence with mobility at baseline. Patient is now limited by functional impairments (see PT problem list below) and requires min assist for transfers and gait with RW. Patient was able to ambulate ~80 feet with RW and min assist. Patient instructed in exercise to facilitate ROM and circulation. Patient will benefit from continued skilled PT interventions to address impairments and progress towards PLOF. Acute PT will follow to progress mobility and stair training in preparation for safe discharge home.     Follow Up Recommendations Follow surgeon's recommendation for DC plan and follow-up therapies;Home health PT    Equipment Recommendations  None recommended by PT    Recommendations for Other Services       Precautions / Restrictions Precautions Precautions: Fall Restrictions Weight Bearing Restrictions: No      Mobility  Bed Mobility Overal bed mobility: Needs Assistance Bed Mobility: Supine to Sit     Supine to sit: Min guard;HOB elevated     General bed mobility comments: no assist required, pt using bed rail, guarding for safety    Transfers Overall transfer level: Needs assistance Equipment used: Rolling walker (2 wheeled) Transfers: Sit to/from Stand Sit to Stand: Min assist         General transfer comment: cues for technique with RW, assist to complete rise and to steady in standing.   Ambulation/Gait Ambulation/Gait assistance: Min assist Gait Distance (Feet): 80 Feet Assistive device: Rolling walker (2 wheeled) Gait Pattern/deviations: Step-to pattern;Decreased stride length;Decreased stance time -  right;Decreased weight shift to right Gait velocity: decr   General Gait Details: cues for safe step pattern in RW and for safe proximity to RW. One episode of slight Rt knee buckling requiring min assist to steady.  Stairs            Wheelchair Mobility    Modified Rankin (Stroke Patients Only)       Balance Overall balance assessment: Needs assistance Sitting-balance support: Feet supported Sitting balance-Leahy Scale: Good     Standing balance support: During functional activity;Bilateral upper extremity supported Standing balance-Leahy Scale: Poor Standing balance comment: required external support                             Pertinent Vitals/Pain Pain Assessment: 0-10 Pain Score: 5  Pain Location: Rt knee Pain Descriptors / Indicators: Aching;Discomfort;Dull;Sore Pain Intervention(s): Limited activity within patient's tolerance;Monitored during session;Repositioned;Ice applied    Home Living Family/patient expects to be discharged to:: Private residence Living Arrangements: Alone Available Help at Discharge: Family Type of Home: House Home Access: Stairs to enter Entrance Stairs-Rails: Left Entrance Stairs-Number of Steps: 2-3 Home Layout: Multi-level Home Equipment: Cane - single point;Walker - 2 wheels Additional Comments: Pt plans to enter through back onto his deck, 2-3 steps Lt rail and level entrance from deck into house. He has to go upstairs to get to bedroom.     Prior Function Level of Independence: Independent               Hand Dominance   Dominant Hand: Right    Extremity/Trunk Assessment   Upper  Extremity Assessment Upper Extremity Assessment: Overall WFL for tasks assessed    Lower Extremity Assessment Lower Extremity Assessment: RLE deficits/detail RLE Deficits / Details: good quad acitvation, no extensor lag with SLR RLE Sensation: WNL RLE Coordination: WNL    Cervical / Trunk Assessment Cervical / Trunk  Assessment: Normal  Communication   Communication: No difficulties  Cognition Arousal/Alertness: Awake/alert Behavior During Therapy: WFL for tasks assessed/performed Overall Cognitive Status: Within Functional Limits for tasks assessed                                        General Comments      Exercises Total Joint Exercises Ankle Circles/Pumps: AROM;Both;20 reps;Seated Quad Sets: AROM;Right;10 reps;Seated Heel Slides: AROM;Right;10 reps;Seated   Assessment/Plan    PT Assessment Patient needs continued PT services  PT Problem List Decreased strength;Decreased range of motion;Decreased activity tolerance;Decreased balance;Decreased mobility;Decreased knowledge of use of DME;Decreased knowledge of precautions       PT Treatment Interventions DME instruction;Gait training;Stair training;Functional mobility training;Therapeutic activities;Balance training;Therapeutic exercise;Patient/family education    PT Goals (Current goals can be found in the Care Plan section)  Acute Rehab PT Goals Patient Stated Goal: get back to golfing PT Goal Formulation: With patient Time For Goal Achievement: 11/29/19 Potential to Achieve Goals: Good    Frequency 7X/week   Barriers to discharge        Co-evaluation               AM-PAC PT "6 Clicks" Mobility  Outcome Measure Help needed turning from your back to your side while in a flat bed without using bedrails?: None Help needed moving from lying on your back to sitting on the side of a flat bed without using bedrails?: A Little Help needed moving to and from a bed to a chair (including a wheelchair)?: A Little Help needed standing up from a chair using your arms (e.g., wheelchair or bedside chair)?: A Little Help needed to walk in hospital room?: A Little Help needed climbing 3-5 steps with a railing? : A Little 6 Click Score: 19    End of Session Equipment Utilized During Treatment: Gait belt Activity  Tolerance: Patient tolerated treatment well Patient left: in chair;with call bell/phone within reach;with chair alarm set;with family/visitor present Nurse Communication: Mobility status PT Visit Diagnosis: Muscle weakness (generalized) (M62.81);Difficulty in walking, not elsewhere classified (R26.2)    Time: 8280-0349 PT Time Calculation (min) (ACUTE ONLY): 24 min   Charges:   PT Evaluation $PT Eval Low Complexity: 1 Low PT Treatments $Gait Training: 8-22 mins        Wynn Maudlin, DPT Acute Rehabilitation Services  Office [phone] Pager [phone]  11/22/2019 3:30 PM

## 2019-11-23 ENCOUNTER — Encounter (HOSPITAL_COMMUNITY): Payer: Self-pay | Admitting: Specialist

## 2019-11-23 LAB — CBC
HCT: 34.1 % — ABNORMAL LOW (ref 39.0–52.0)
Hemoglobin: 11.7 g/dL — ABNORMAL LOW (ref 13.0–17.0)
MCH: 31.3 pg (ref 26.0–34.0)
MCHC: 34.3 g/dL (ref 30.0–36.0)
MCV: 91.2 fL (ref 80.0–100.0)
Platelets: 297 10*3/uL (ref 150–400)
RBC: 3.74 MIL/uL — ABNORMAL LOW (ref 4.22–5.81)
RDW: 12.2 % (ref 11.5–15.5)
WBC: 14.5 10*3/uL — ABNORMAL HIGH (ref 4.0–10.5)
nRBC: 0 % (ref 0.0–0.2)

## 2019-11-23 LAB — BASIC METABOLIC PANEL
Anion gap: 10 (ref 5–15)
BUN: 15 mg/dL (ref 8–23)
CO2: 20 mmol/L — ABNORMAL LOW (ref 22–32)
Calcium: 8.2 mg/dL — ABNORMAL LOW (ref 8.9–10.3)
Chloride: 105 mmol/L (ref 98–111)
Creatinine, Ser: 0.97 mg/dL (ref 0.61–1.24)
GFR, Estimated: >60 mL/min (ref >60)
Glucose, Bld: 157 mg/dL — ABNORMAL HIGH (ref 70–99)
Potassium: 4.1 mmol/L (ref 3.5–5.1)
Sodium: 135 mmol/L (ref 135–145)

## 2019-11-23 MED ORDER — OXYCODONE HCL 5 MG PO TABS: 5.0000 mg | ORAL_TABLET | ORAL | 0 refills | Status: AC | PRN

## 2019-11-23 MED ORDER — POLYETHYLENE GLYCOL 3350 17 G PO PACK: 17.0000 g | PACK | Freq: Every day | ORAL | 0 refills | Status: AC | PRN

## 2019-11-23 MED ORDER — DOCUSATE SODIUM 100 MG PO CAPS: 100.0000 mg | ORAL_CAPSULE | Freq: Two times a day (BID) | ORAL | 0 refills | Status: AC

## 2019-11-23 MED ORDER — ASPIRIN 81 MG PO CHEW: 81.0000 mg | CHEWABLE_TABLET | Freq: Two times a day (BID) | ORAL | 1 refills | Status: AC

## 2019-11-23 NOTE — Progress Notes (Signed)
Subjective: 1 Day Post-Op Procedure(s) (LRB): TOTAL KNEE ARTHROPLASTY (Right) Patient reports pain as mild.    Objective: Vital signs in last 24 hours: Temp:  [97.4 F (36.3 C)-98.2 F (36.8 C)] 97.9 F (36.6 C) (10/28 0652) Pulse Rate:  [67-90] 90 (10/28 0652) Resp:  [12-18] 16 (10/28 0652) BP: (112-176)/(68-88) 148/87 (10/28 0652) SpO2:  [97 %-100 %] 100 % (10/28 4193)  Intake/Output from previous day: 10/27 0701 - 10/28 0700 In: 2076.1 [P.O.:1200; I.V.:776.1; IV Piggyback:100] Out: 5250 [Urine:5200; Blood:50] Intake/Output this shift: No intake/output data recorded.  Recent Labs    11/23/19 0249  HGB 11.7*   Recent Labs    11/23/19 0249  WBC 14.5*  RBC 3.74*  HCT 34.1*  PLT 297   Recent Labs    11/23/19 0249  NA 135  K 4.1  CL 105  CO2 20*  BUN 15  CREATININE 0.97  GLUCOSE 157*  CALCIUM 8.2*   No results for input(s): LABPT, INR in the last 72 hours.  Neurologically intact ABD soft Neurovascular intact Sensation intact distally Intact pulses distally Dorsiflexion/Plantar flexion intact Incision: dressing C/D/I and no drainage No cellulitis present Compartment soft no calf pain or sign of DVT   Assessment/Plan: 1 Day Post-Op Procedure(s) (LRB): TOTAL KNEE ARTHROPLASTY (Right) Advance diet Up with therapy D/C IV fluids   Anticipated LOS equal to or greater than 2 midnights due to - Age 65 and older with one or more of the following:  - Obesity  - Expected need for hospital services (PT, OT, Nursing) required for safe  discharge  - Anticipated need for postoperative skilled nursing care or inpatient rehab  - Active co-morbidities: None OR   - Unanticipated findings during/Post Surgery: None  - Patient is a high risk of re-admission due to: None  Home Health PT needs to be arranged Possible D/C later today if continues to do well with pain control and PT Discussed with Dr. Tenna Delaine Doralee Albino 11/23/2019, 8:41 AM

## 2019-11-23 NOTE — Progress Notes (Addendum)
Physical Therapy Treatment Patient Details Name: Bradley Harrell MRN: [id number] DOB: 10-Jul-1955 Today's Date: 11/23/2019    History of Present Illness Patient is 64 y.o. male s/p Rt TKA on 11/22/19 with PMH significant for OA, GERD., Rt RCR, and Lt knee scope.     PT Comments    Pt tolerates further ambulation this session, cued for R step length and maintaining close RW contact due to 1 incident of R knee buckling when turning without use of RW. Pt with good carryover when cued for quad activation in stance. Pt tolerates therapeutic exercise with good quad activation, attempted SLR but only able to lift ~1-2 inches off mat. Provided written/illustrated exercises with verbal understanding. Pt able to navigate steps with bil HR, educate don use of crutch since only has 1 handrail at home and pt verbalizes feeling confident in performing. RN notified of pt's good understanding of safety with mobility, pt with good family support and meeting goals.   Follow Up Recommendations  Follow surgeon's recommendation for DC plan and follow-up therapies;Home health PT     Equipment Recommendations  None recommended by PT    Recommendations for Other Services       Precautions / Restrictions Precautions Precautions: Fall Restrictions Weight Bearing Restrictions: No    Mobility  Bed Mobility Overal bed mobility: Needs Assistance Bed Mobility: Supine to Sit  Supine to sit: Modified independent (Device/Increase time);HOB elevated  General bed mobility comments: minimal use of bedrail to upright trunk and scoot to EOB  Transfers Overall transfer level: Needs assistance Equipment used: Rolling walker (2 wheeled) Transfers: Sit to/from Stand Sit to Stand: Supervision  General transfer comment: BUE assistig to power up, RLE slightly anterior to L for comfort, no physical assist  Ambulation/Gait Ambulation/Gait assistance: Supervision Gait Distance (Feet): 100 Feet Assistive device: Rolling  walker (2 wheeled) Gait Pattern/deviations: Step-to pattern;Decreased stride length Gait velocity: decreased   General Gait Details: cues to decrease R step length and maintain body within RW frame, attempts turn without RW with R knee buckling noted but able to steady independently, cued for R quad activation in stance to prevent buckling   Stairs Stairs: Yes Stairs assistance: Min guard Stair Management: Two rails Number of Stairs: 3 General stair comments: educated on sequencing (up with good, down with bad) with good carryover, BUE assisting on handrail, good steadiness   Wheelchair Mobility    Modified Rankin (Stroke Patients Only)       Balance Overall balance assessment: Needs assistance Sitting-balance support: Feet supported Sitting balance-Leahy Scale: Good     Standing balance support: During functional activity;Bilateral upper extremity supported Standing balance-Leahy Scale: Fair Standing balance comment: static without UE support, dynamic with RW     Cognition Arousal/Alertness: Awake/alert Behavior During Therapy: WFL for tasks assessed/performed Overall Cognitive Status: Within Functional Limits for tasks assessed             Exercises Total Joint Exercises Ankle Circles/Pumps: AROM;Both;Seated;20 reps Quad Sets: AROM;Right;Seated;10 reps Heel Slides: AROM;Right;Seated;10 reps (2 sets)    General Comments        Pertinent Vitals/Pain Pain Assessment: 0-10 Pain Score: 4  Pain Location: Rt knee Pain Descriptors / Indicators: Aching;Sore Pain Intervention(s): Limited activity within patient's tolerance;Monitored during session;Premedicated before session;Repositioned;Ice applied    Home Living                      Prior Function            PT Goals (current  goals can now be found in the care plan section) Acute Rehab PT Goals Patient Stated Goal: get back to golfing PT Goal Formulation: With patient Time For Goal Achievement:  11/29/19 Potential to Achieve Goals: Good Progress towards PT goals: Progressing toward goals    Frequency    7X/week      PT Plan Current plan remains appropriate    Co-evaluation              AM-PAC PT "6 Clicks" Mobility   Outcome Measure  Help needed turning from your back to your side while in a flat bed without using bedrails?: None Help needed moving from lying on your back to sitting on the side of a flat bed without using bedrails?: None Help needed moving to and from a bed to a chair (including a wheelchair)?: A Little Help needed standing up from a chair using your arms (e.g., wheelchair or bedside chair)?: A Little Help needed to walk in hospital room?: A Little Help needed climbing 3-5 steps with a railing? : A Little 6 Click Score: 20    End of Session Equipment Utilized During Treatment: Gait belt Activity Tolerance: Patient tolerated treatment well Patient left: in chair;with call bell/phone within reach Nurse Communication: Mobility status PT Visit Diagnosis: Muscle weakness (generalized) (M62.81);Difficulty in walking, not elsewhere classified (R26.2)     Time: 8891-6945 PT Time Calculation (min) (ACUTE ONLY): 32 min  Charges:  $Gait Training: 8-22 mins $Therapeutic Exercise: 8-22 mins                      Tori Robyne Matar PT, DPT 11/23/19, 12:54 PM

## 2019-11-23 NOTE — Plan of Care (Signed)
Patient discharged, all care plans completed/discontinued.

## 2019-11-23 NOTE — Discharge Summary (Signed)
Physician Discharge Summary   Patient ID: Bradley Harrell MRN: [id number] DOB/AGE: 1955-10-17 64 y.o.  Admit date: 11/22/2019 Discharge date: 11/23/2019  Primary Diagnosis: Right knee primary osteoarthritis  Admission Diagnoses:  Past Medical History:  Diagnosis Date  . Arthritis   . GERD (gastroesophageal reflux disease)    Discharge Diagnoses:   Active Problems:   S/P TKR (total knee replacement) using cement  Estimated body mass index is 29.29 kg/m as calculated from the following:   Height as of this encounter: 5\' 11"  (1.803 m).   Weight as of this encounter: 95.3 kg.  Procedure:  Procedure(s) (LRB): TOTAL KNEE ARTHROPLASTY (Right)   Consults: None  HPI: see pre-op H&P Laboratory Data: Admission on 11/22/2019  Component Date Value Ref Range Status  . WBC 11/23/2019 14.5* 4.0 - 10.5 K/uL Final  . RBC 11/23/2019 3.74* 4.22 - 5.81 MIL/uL Final  . Hemoglobin 11/23/2019 11.7* 13.0 - 17.0 g/dL Final  . HCT 33/29/518810/28/2021 34.1* 39 - 52 % Final  . MCV 11/23/2019 91.2  80.0 - 100.0 fL Final  . MCH 11/23/2019 31.3  26.0 - 34.0 pg Final  . MCHC 11/23/2019 34.3  30.0 - 36.0 g/dL Final  . RDW 41/66/063010/28/2021 12.2  11.5 - 15.5 % Final  . Platelets 11/23/2019 297  150 - 400 K/uL Final  . nRBC 11/23/2019 0.0  0.0 - 0.2 % Final   Performed at [facility], [address] [place], [place] [address]  . Sodium 11/23/2019 135  135 - 145 mmol/L Final  . Potassium 11/23/2019 4.1  3.5 - 5.1 mmol/L Final  . Chloride 11/23/2019 105  98 - 111 mmol/L Final  . CO2 11/23/2019 20* 22 - 32 mmol/L Final  . Glucose, Bld 11/23/2019 157* 70 - 99 mg/dL Final   Glucose reference range applies only to samples taken after fasting for at least 8 hours.  . BUN 11/23/2019 15  8 - 23 mg/dL Final  . Creatinine, Ser 11/23/2019 0.97  0.61 - 1.24 mg/dL Final  . Calcium 93/23/557310/28/2021 8.2* 8.9 - 10.3 mg/dL Final  . GFR, Estimated 11/23/2019 >60  >60 mL/min Final   Comment: (NOTE) Calculated using  the CKD-EPI Creatinine Equation (2021)   . Anion gap 11/23/2019 10  5 - 15 Final   Performed at [facility], [address] [place], [place] [address]  Hospital Outpatient Visit on 11/18/2019  Component Date Value Ref Range Status  . SARS Coronavirus 2 11/18/2019 NEGATIVE  NEGATIVE Final   Comment: (NOTE) SARS-CoV-2 target nucleic acids are NOT DETECTED.  The SARS-CoV-2 RNA is generally detectable in upper and lower respiratory specimens during the acute phase of infection. Negative results do not preclude SARS-CoV-2 infection, do not rule out co-infections with other pathogens, and should not be used as the sole basis for treatment or other patient management decisions. Negative results must be combined with clinical observations, patient history, and epidemiological information. The expected result is Negative.  Fact Sheet for Patients: HairSlick.nohttps://www.fda.gov/media/138098/download  Fact Sheet for Healthcare Providers: quierodirigir.comhttps://www.fda.gov/media/138095/download  This test is not yet approved or cleared by the [place] FDA and  has been authorized for detection and/or diagnosis of SARS-CoV-2 by FDA under an Emergency Use Authorization (EUA). This EUA will remain  in effect (meaning this test can be used) for the duration of the COVID-19 declaration under Se  ction 564(b)(1) of the Act, 21 U.S.C. section 360bbb-3(b)(1), unless the authorization is terminated or revoked sooner.  Performed at [facility], [address], [place], [place] [address]   Hospital Outpatient Visit on 11/16/2019  Component Date Value Ref Range Status  . aPTT 11/16/2019 30  24 - 36 seconds Final   Performed at [facility], [address] [place], [place] [address]  . Sodium 11/16/2019 138  135 - 145 mmol/L Final  . Potassium 11/16/2019 4.5  3.5 - 5.1 mmol/L Final  . Chloride 11/16/2019 103  98 - 111 mmol/L Final  . CO2  11/16/2019 24  22 - 32 mmol/L Final  . Glucose, Bld 11/16/2019 102* 70 - 99 mg/dL Final   Glucose reference range applies only to samples taken after fasting for at least 8 hours.  . BUN 11/16/2019 12  8 - 23 mg/dL Final  . Creatinine, Ser 11/16/2019 0.89  0.61 - 1.24 mg/dL Final  . Calcium 01/75/1025 9.1  8.9 - 10.3 mg/dL Final  . GFR, Estimated 11/16/2019 >60  >60 mL/min Final   Comment: (NOTE) Calculated using the CKD-EPI Creatinine Equation (2021)   . Anion gap 11/16/2019 11  5 - 15 Final   Performed at [facility], [address], [place], [place] [address]  . WBC 11/16/2019 8.8  4.0 - 10.5 K/uL Final  . RBC 11/16/2019 4.68  4.22 - 5.81 MIL/uL Final  . Hemoglobin 11/16/2019 14.5  13.0 - 17.0 g/dL Final  . HCT 82/42/3536 43.0  39 - 52 % Final  . MCV 11/16/2019 91.9  80.0 - 100.0 fL Final  . MCH 11/16/2019 31.0  26.0 - 34.0 pg Final  . MCHC 11/16/2019 33.7  30.0 - 36.0 g/dL Final  . RDW 14/43/1540 12.2  11.5 - 15.5 % Final  . Platelets 11/16/2019 344  150 - 400 K/uL Final  . nRBC 11/16/2019 0.0  0.0 - 0.2 % Final   Performed at [facility], [address], [place], [place] [address]  . Prothrombin Time 11/16/2019 12.2  11.4 - 15.2 seconds Final  . INR 11/16/2019 0.9  0.8 - 1.2 Final   Comment: (NOTE) INR goal varies based on device and disease states. Performed at [facility], [address], [place], [place] [address]   . Color, Urine 11/16/2019 YELLOW  YELLOW Final  . APPearance 11/16/2019 CLEAR  CLEAR Final  . Specific Gravity, Urine 11/16/2019 1.010  1.005 - 1.030 Final  . pH 11/16/2019 6.5  5.0 - 8.0 Final  . Glucose, UA 11/16/2019 NEGATIVE  NEGATIVE mg/dL Final  . Hgb urine dipstick 11/16/2019 TRACE* NEGATIVE Final  . Bilirubin Urine 11/16/2019 NEGATIVE  NEGATIVE Final  . Ketones, ur 11/16/2019 NEGATIVE  NEGATIVE mg/dL Final  . Protein, ur 32/67/1245 NEGATIVE  NEGATIVE mg/dL Final  . Nitrite 80/99/8338  NEGATIVE  NEGATIVE Final  . Glori Luis 11/16/2019 NEGATIVE  NEGATIVE Final   Performed at [facility], [address] [place], [place] [address]  . MRSA, PCR 11/16/2019 NEGATIVE  NEGATIVE Final  . Staphylococcus aureus 11/16/2019 NEGATIVE  NEGATIVE Final   Comment: (NOTE) The Xpert SA Assay (FDA approved for NASAL specimens in patients 42 years of age and older), is one component of a comprehensive surveillance program. It is not intended to diagnose infection nor to guide or monitor treatment. Performed at [facility], [address], [place], [place] [address]   . RBC / HPF 11/16/2019 0-5  0 - 5 RBC/hpf Final  . WBC, UA 11/16/2019 0-5  0 - 5 WBC/hpf Final  . Bacteria, UA 11/16/2019 RARE* NONE SEEN Final  . Squamous Epithelial / LPF 11/16/2019 0-5  0 - 5 Final   Performed at [facility], [address] [place], [place] [address]     X-Rays:DG Knee 1-2 Views Right  Result Date: 11/22/2019 CLINICAL DATA:  Status post right knee replacement. EXAM: RIGHT KNEE - 1-2 VIEW COMPARISON:  None. FINDINGS: Right femoral and tibial components appear to be well situated. Expected postoperative changes noted in the soft tissues anteriorly. IMPRESSION: Status post right total knee arthroplasty. Electronically Signed   By: Lupita Raider M.D.   On: 11/22/2019 10:18    EKG:No orders found for this or any previous visit.   Hospital Course: Bradley Harrell is a 64 y.o. who was admitted to [facility]. They were brought to the operating room on 11/22/2019 and underwent Procedure(s): TOTAL KNEE ARTHROPLASTY.  Patient tolerated the procedure well and was later transferred to the recovery room and then to the orthopaedic floor for postoperative care.  They were given PO and IV analgesics for pain control following their surgery.  They were given 24 hours of postoperative antibiotics of  Anti-infectives (From admission, onward)   Start      Dose/Rate Route Frequency Ordered Stop   11/22/19 1700  vancomycin (VANCOREADY) IVPB 1500 mg/300 mL        1,500 mg 150 mL/hr over 120 Minutes Intravenous Every 12 hours 11/22/19 1113 11/22/19 1817   11/22/19 0800  gentamicin (GARAMYCIN) 120 mg in dextrose 5 % 100 mL IVPB        120 mg 103 mL/hr over 60 Minutes Intravenous  Once 11/22/19 0733 11/22/19 0852   11/22/19 0715  gentamicin (GARAMYCIN) IVPB 120 mg  Status:  Discontinued        120 mg 200 mL/hr over 30 Minutes Intravenous  Once 11/22/19 0713 11/22/19 0734   11/22/19 0600  vancomycin (VANCOREADY) IVPB 1500 mg/300 mL        1,500 mg 150 mL/hr over 120 Minutes Intravenous On call to O.R. 11/21/19 0715 11/22/19 0800     and started on DVT prophylaxis in the form of Aspirin, TED hose and SCDs.   PT and OT were ordered for total joint protocol.  Discharge planning consulted to help with postop disposition and equipment needs.  Patient had a fair night on the evening of surgery.  They started to get up OOB with therapy on day one. By day one, the patient had progressed with therapy and meeting their goals.  Incision was healing well.  Patient was seen in rounds and was ready to go home.   Diet: Regular diet Activity:WBAT Follow-up:in 10-14 days Disposition - Home Discharged Condition: good   Discharge Instructions    Call MD / Call 911   Complete by: As directed    If you experience chest pain or shortness of breath, CALL 911 and be transported to the hospital emergency room.  If you develope a fever above 101 F, pus (white drainage) or increased drainage or redness at the wound, or calf pain, call your surgeon's office.   Constipation Prevention   Complete by: As directed    Drink plenty of fluids.  Prune juice may be helpful.  You may use a stool softener, such as Colace (over the counter) 100 mg twice a day.  Use MiraLax (over the counter) for constipation  as needed.   Diet - low sodium heart healthy   Complete by: As  directed    Increase activity slowly as tolerated   Complete by: As directed      Allergies as of 11/23/2019      Reactions   Cefaclor Rash, Other (See Comments)   Skin irritation.      Medication List    TAKE these medications   aspirin 81 MG chewable tablet Chew 1 tablet (81 mg total) by mouth 2 (two) times daily.   atorvastatin 20 MG tablet Commonly known as: LIPITOR Take 20 mg by mouth every evening.   docusate sodium 100 MG capsule Commonly known as: COLACE Take 1 capsule (100 mg total) by mouth 2 (two) times daily.   Fish Oil 1000 MG Caps Take 1,000 mg by mouth in the morning and at bedtime.   fluticasone 50 MCG/ACT nasal spray Commonly known as: FLONASE Place 1-2 sprays into both nostrils daily as needed for rhinitis (sinus/allergies.).   meloxicam 15 MG tablet Commonly known as: MOBIC Take 15 mg by mouth daily as needed (knee pain.).   oxyCODONE 5 MG immediate release tablet Commonly known as: Oxy IR/ROXICODONE Take 1-2 tablets (5-10 mg total) by mouth every 4 (four) hours as needed for moderate pain.   polyethylene glycol 17 g packet Commonly known as: MIRALAX / GLYCOLAX Take 17 g by mouth daily as needed for mild constipation.   vitamin B-12 1000 MCG tablet Commonly known as: CYANOCOBALAMIN Take 1,000 mcg by mouth daily.       Follow-up Information    Jene Every, MD Follow up in 2 week(s).   Specialty: Orthopedic Surgery Contact information: [address] [place] [address] [phone]               Signed: Andrez Grime, PA-C Orthopaedic Surgery 11/23/2019, 11:55 AM

## 2019-11-23 NOTE — Plan of Care (Signed)
  Problem: Clinical Measurements: Goal: Diagnostic test results will improve Outcome: Progressing   Problem: Activity: Goal: Risk for activity intolerance will decrease Outcome: Progressing   Problem: Elimination: Goal: Will not experience complications related to urinary retention Outcome: Progressing

## 2019-11-23 NOTE — TOC Progression Note (Signed)
Transition of Care [facility]) - Progression Note    Patient Details  Name: Bradley Harrell MRN: [id number] Date of Birth: January 25, 1956  Transition [facility]) CM/SW Contact  Lennart Pall, LCSW Phone Number: 11/23/2019, 12:20 PM  Clinical Narrative:    Met with pt who is cleared for dc today. Orders in for HHPT - referral placed with [facility]. NO DME needs.  Ready for dc.     Barriers to Discharge: Barriers Resolved  Expected Discharge Plan and Services           Expected Discharge Date: 11/23/19               DME Arranged: N/A DME Agency: NA       HH Arranged: PT HH Agency: [facility] (formerly [employer]) Date [place]: 11/23/19 Time [place]: 1220 Representative spoke with at [place]: [place] ([place]) Interventions    Readmission Risk Interventions Readmission Risk Prevention Plan 11/23/2019  Post Dischage Appt Complete  Medication Screening Complete  Transportation Screening Complete  Some recent data might be hidden

## 2020-01-04 ENCOUNTER — Other Ambulatory Visit: Payer: Self-pay | Admitting: *Deleted

## 2020-01-04 DIAGNOSIS — Z87891 Personal history of nicotine dependence: Secondary | ICD-10-CM

## 2020-01-24 ENCOUNTER — Ambulatory Visit
Admission: RE | Admit: 2020-01-24 | Discharge: 2020-01-24 | Disposition: A | Discharge status: Home / Self Care | Payer: Medicare Other | Source: Ambulatory Visit | Attending: Acute Care | Admitting: Acute Care

## 2020-01-24 DIAGNOSIS — Z87891 Personal history of nicotine dependence: Secondary | ICD-10-CM

## 2020-01-30 ENCOUNTER — Other Ambulatory Visit: Payer: Self-pay | Admitting: *Deleted

## 2020-01-30 DIAGNOSIS — Z87891 Personal history of nicotine dependence: Secondary | ICD-10-CM

## 2020-01-30 DIAGNOSIS — M25561 Pain in right knee: Secondary | ICD-10-CM | POA: Diagnosis not present

## 2020-01-30 NOTE — Progress Notes (Signed)
Please call patient and let them  know their  low dose Ct was read as a Lung RADS 2: nodules that are benign in appearance and behavior with a very low likelihood of becoming a clinically active cancer due to size or lack of growth. Recommendation per radiology is for a repeat LDCT in 12 months. .Please let them  know we will order and schedule their  annual screening scan for 12/2021. Pt.  is  currently on statin therapy. Please place order for annual  screening scan for  12/2021 and fax results to PCP. Thanks so much.

## 2020-02-06 DIAGNOSIS — M25561 Pain in right knee: Secondary | ICD-10-CM | POA: Diagnosis not present

## 2021-01-24 ENCOUNTER — Ambulatory Visit
Admission: RE | Admit: 2021-01-24 | Discharge: 2021-01-24 | Disposition: A | Discharge status: Home / Self Care | Payer: Medicare Other | Source: Ambulatory Visit | Attending: Acute Care | Admitting: Acute Care

## 2021-01-24 DIAGNOSIS — Z87891 Personal history of nicotine dependence: Secondary | ICD-10-CM

## 2021-01-28 ENCOUNTER — Other Ambulatory Visit: Payer: Self-pay | Admitting: Acute Care

## 2021-01-28 DIAGNOSIS — Z87891 Personal history of nicotine dependence: Secondary | ICD-10-CM

## 2021-02-14 ENCOUNTER — Telehealth: Payer: Self-pay | Admitting: Acute Care

## 2021-02-14 NOTE — Telephone Encounter (Signed)
Spoke with patient to review results of CT.  He had questions about atherosclerosis and emphysema, as he did not recall that being on previous scans.  Both were mentioned on previous scan.  Patient had not further questions.

## 2021-09-25 ENCOUNTER — Other Ambulatory Visit: Payer: Self-pay | Admitting: Family Medicine

## 2021-09-25 DIAGNOSIS — F17219 Nicotine dependence, cigarettes, with unspecified nicotine-induced disorders: Secondary | ICD-10-CM

## 2021-10-30 DIAGNOSIS — R109 Unspecified abdominal pain: Secondary | ICD-10-CM | POA: Diagnosis not present

## 2021-10-30 DIAGNOSIS — R300 Dysuria: Secondary | ICD-10-CM | POA: Diagnosis not present

## 2021-10-30 DIAGNOSIS — R82998 Other abnormal findings in urine: Secondary | ICD-10-CM | POA: Diagnosis not present

## 2021-11-07 DIAGNOSIS — R82998 Other abnormal findings in urine: Secondary | ICD-10-CM | POA: Diagnosis not present

## 2021-11-28 DIAGNOSIS — H5203 Hypermetropia, bilateral: Secondary | ICD-10-CM | POA: Diagnosis not present

## 2021-12-24 DIAGNOSIS — J069 Acute upper respiratory infection, unspecified: Secondary | ICD-10-CM | POA: Diagnosis not present

## 2022-01-27 ENCOUNTER — Ambulatory Visit
Admission: RE | Admit: 2022-01-27 | Discharge: 2022-01-27 | Disposition: A | Discharge status: Home / Self Care | Payer: Medicare Other | Source: Ambulatory Visit | Attending: Family Medicine | Admitting: Family Medicine

## 2022-01-27 DIAGNOSIS — I700 Atherosclerosis of aorta: Secondary | ICD-10-CM | POA: Diagnosis not present

## 2022-01-27 DIAGNOSIS — F17219 Nicotine dependence, cigarettes, with unspecified nicotine-induced disorders: Secondary | ICD-10-CM

## 2022-01-27 DIAGNOSIS — Z87891 Personal history of nicotine dependence: Secondary | ICD-10-CM | POA: Diagnosis not present

## 2022-01-27 DIAGNOSIS — J432 Centrilobular emphysema: Secondary | ICD-10-CM | POA: Diagnosis not present

## 2022-01-27 DIAGNOSIS — R918 Other nonspecific abnormal finding of lung field: Secondary | ICD-10-CM | POA: Diagnosis not present

## 2022-02-04 ENCOUNTER — Other Ambulatory Visit: Payer: Self-pay | Admitting: *Deleted

## 2022-02-04 DIAGNOSIS — Z122 Encounter for screening for malignant neoplasm of respiratory organs: Secondary | ICD-10-CM

## 2022-02-04 DIAGNOSIS — Z87891 Personal history of nicotine dependence: Secondary | ICD-10-CM

## 2022-03-31 DIAGNOSIS — L820 Inflamed seborrheic keratosis: Secondary | ICD-10-CM | POA: Diagnosis not present

## 2022-03-31 DIAGNOSIS — D2339 Other benign neoplasm of skin of other parts of face: Secondary | ICD-10-CM | POA: Diagnosis not present

## 2022-06-18 DIAGNOSIS — K5730 Diverticulosis of large intestine without perforation or abscess without bleeding: Secondary | ICD-10-CM | POA: Diagnosis not present

## 2022-06-18 DIAGNOSIS — D125 Benign neoplasm of sigmoid colon: Secondary | ICD-10-CM | POA: Diagnosis not present

## 2022-06-18 DIAGNOSIS — Z1211 Encounter for screening for malignant neoplasm of colon: Secondary | ICD-10-CM | POA: Diagnosis not present

## 2022-06-23 DIAGNOSIS — D125 Benign neoplasm of sigmoid colon: Secondary | ICD-10-CM | POA: Diagnosis not present

## 2022-07-20 DIAGNOSIS — M13841 Other specified arthritis, right hand: Secondary | ICD-10-CM | POA: Diagnosis not present

## 2022-09-03 DIAGNOSIS — Z87891 Personal history of nicotine dependence: Secondary | ICD-10-CM | POA: Diagnosis not present

## 2022-09-03 DIAGNOSIS — R079 Chest pain, unspecified: Secondary | ICD-10-CM | POA: Diagnosis not present

## 2022-09-03 DIAGNOSIS — Z743 Need for continuous supervision: Secondary | ICD-10-CM | POA: Diagnosis not present

## 2022-09-03 DIAGNOSIS — R0789 Other chest pain: Secondary | ICD-10-CM | POA: Diagnosis not present

## 2022-09-03 DIAGNOSIS — R0902 Hypoxemia: Secondary | ICD-10-CM | POA: Diagnosis not present

## 2022-09-03 DIAGNOSIS — R9431 Abnormal electrocardiogram [ECG] [EKG]: Secondary | ICD-10-CM | POA: Diagnosis not present

## 2022-09-30 DIAGNOSIS — E782 Mixed hyperlipidemia: Secondary | ICD-10-CM | POA: Diagnosis not present

## 2022-09-30 DIAGNOSIS — Z0000 Encounter for general adult medical examination without abnormal findings: Secondary | ICD-10-CM | POA: Diagnosis not present

## 2022-09-30 DIAGNOSIS — Z23 Encounter for immunization: Secondary | ICD-10-CM | POA: Diagnosis not present

## 2022-09-30 DIAGNOSIS — Z79899 Other long term (current) drug therapy: Secondary | ICD-10-CM | POA: Diagnosis not present

## 2023-01-28 ENCOUNTER — Ambulatory Visit
Admission: RE | Admit: 2023-01-28 | Discharge: 2023-01-28 | Disposition: A | Discharge status: Home / Self Care | Payer: Medicare Other | Source: Ambulatory Visit | Attending: Family Medicine | Admitting: Family Medicine

## 2023-01-28 DIAGNOSIS — Z87891 Personal history of nicotine dependence: Secondary | ICD-10-CM

## 2023-01-28 DIAGNOSIS — Z122 Encounter for screening for malignant neoplasm of respiratory organs: Secondary | ICD-10-CM

## 2023-02-09 ENCOUNTER — Other Ambulatory Visit: Payer: Self-pay

## 2023-02-09 DIAGNOSIS — Z122 Encounter for screening for malignant neoplasm of respiratory organs: Secondary | ICD-10-CM

## 2023-02-09 DIAGNOSIS — F17210 Nicotine dependence, cigarettes, uncomplicated: Secondary | ICD-10-CM

## 2023-02-09 DIAGNOSIS — M545 Low back pain, unspecified: Secondary | ICD-10-CM | POA: Diagnosis not present

## 2023-02-09 DIAGNOSIS — Z87891 Personal history of nicotine dependence: Secondary | ICD-10-CM

## 2023-03-09 DIAGNOSIS — M545 Low back pain, unspecified: Secondary | ICD-10-CM | POA: Diagnosis not present

## 2023-03-09 DIAGNOSIS — M722 Plantar fascial fibromatosis: Secondary | ICD-10-CM | POA: Diagnosis not present

## 2023-04-02 DIAGNOSIS — M545 Low back pain, unspecified: Secondary | ICD-10-CM | POA: Diagnosis not present

## 2023-04-07 DIAGNOSIS — M545 Low back pain, unspecified: Secondary | ICD-10-CM | POA: Diagnosis not present

## 2023-04-08 DIAGNOSIS — R0989 Other specified symptoms and signs involving the circulatory and respiratory systems: Secondary | ICD-10-CM | POA: Diagnosis not present

## 2023-04-16 IMAGING — CT CT CHEST LUNG CANCER SCREENING LOW DOSE W/O CM
2 of 5 series · 15 of 40 positions shown, 18 images · non-contrast
Comparison: Multiple priors, most recent CT chest dated [place]

CLINICAL DATA: Former smoker with 44 pack-year history

EXAM:
CT CHEST WITHOUT CONTRAST LOW-DOSE FOR LUNG CANCER SCREENING
TECHNIQUE: Multidetector CT imaging of the chest was performed following the
standard protocol without IV contrast.

[Series 4: lung 1.00 br44 cor · coronal · 0.73mm/px · 3 of 349 slices shown]
[im 70/349  lung]
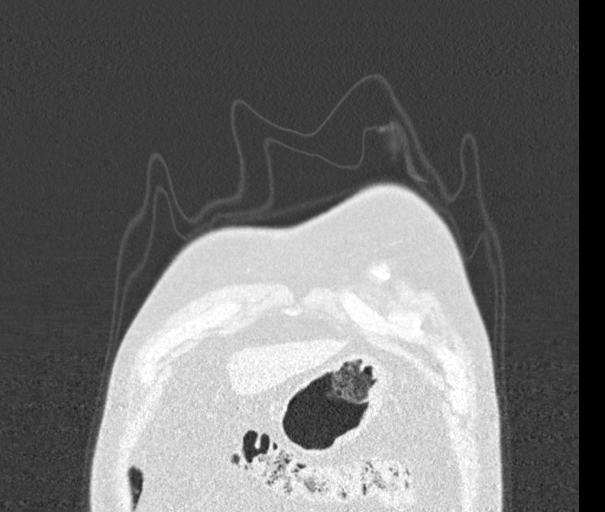
[im 140/349  lung]
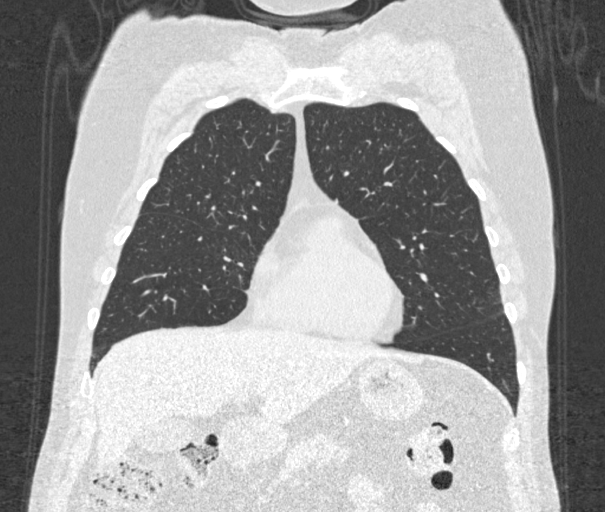
[im 209/349  lung]
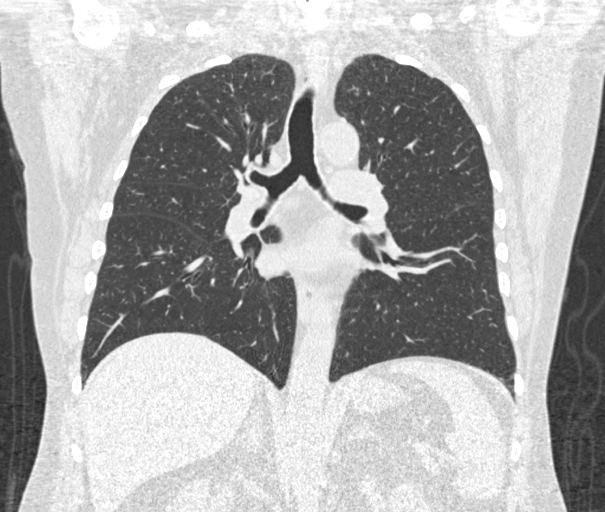

[Series 9: lung 1.00 br60 axial · axial · 0.81mm/px · z∈[-1039,-699]mm · 12 of 374 slices shown, 15 images]
[im 17/374  mediastinal]
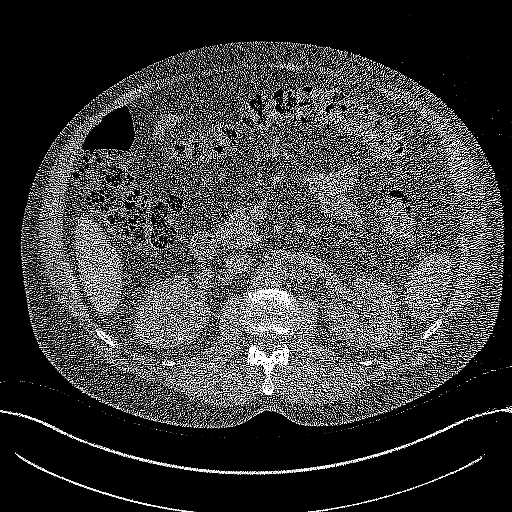
[im 17/374  lung]
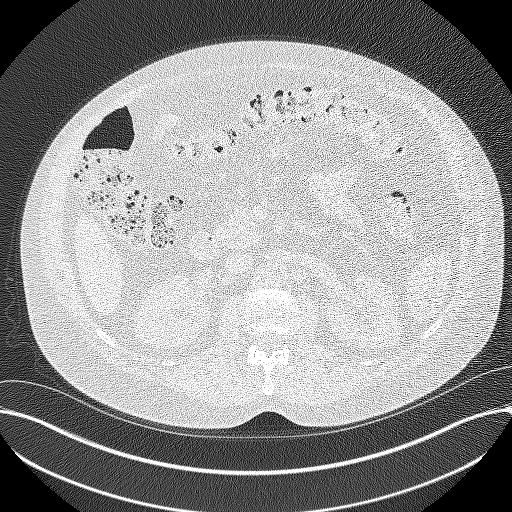
[im 51/374  lung]
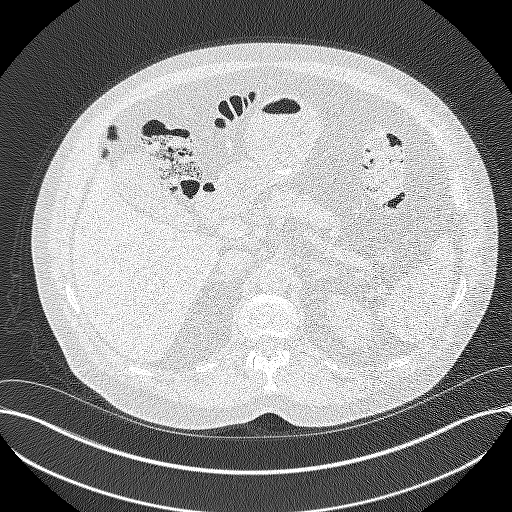
[im 85/374  lung]
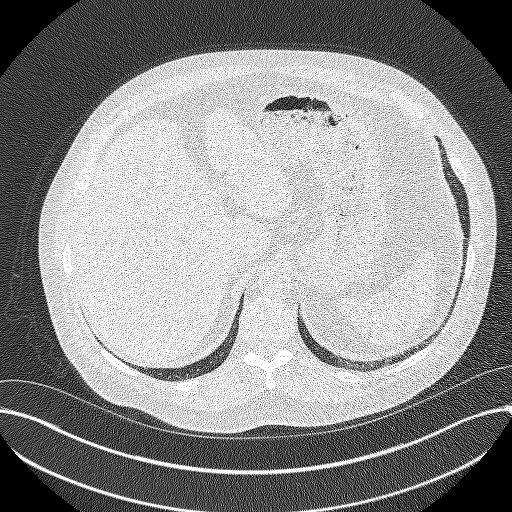
[im 119/374  lung]
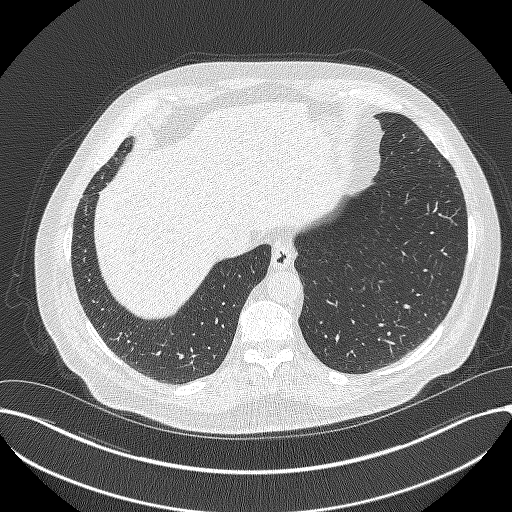
[im 136/374  mediastinal]
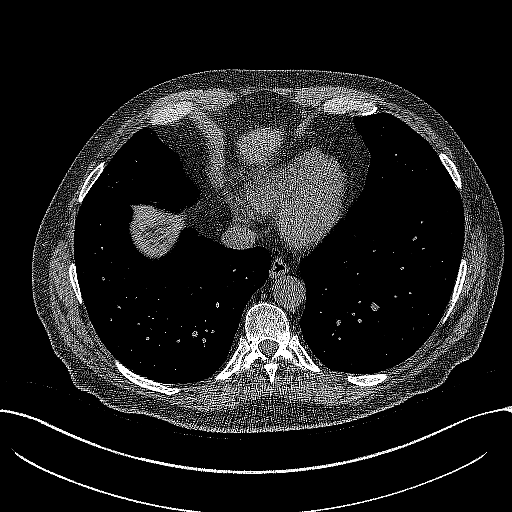
[im 136/374  lung]
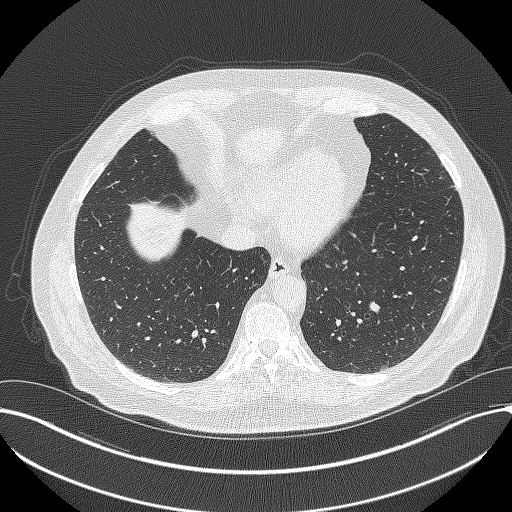
[im 170/374  lung]
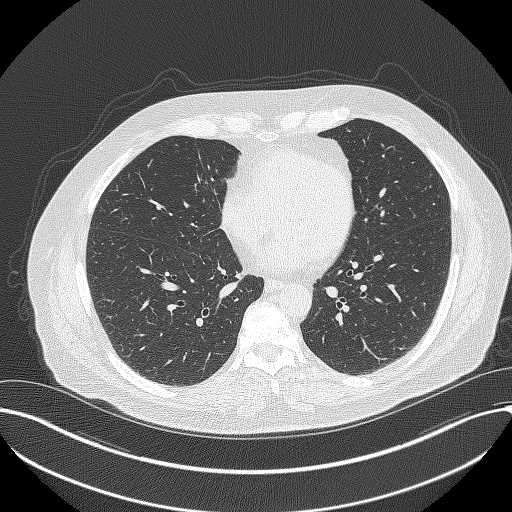
[im 204/374  lung]
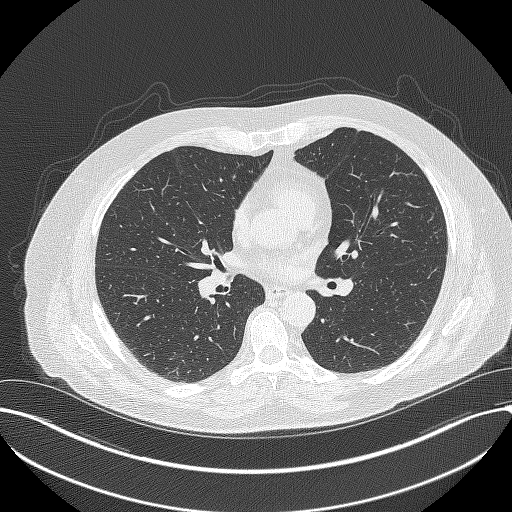
[im 238/374  lung]
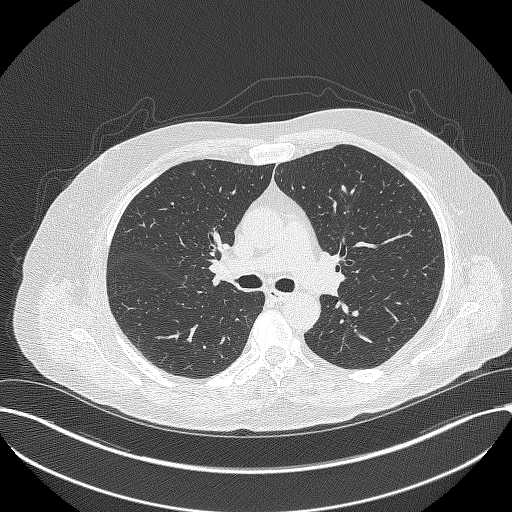
[im 255/374  mediastinal]
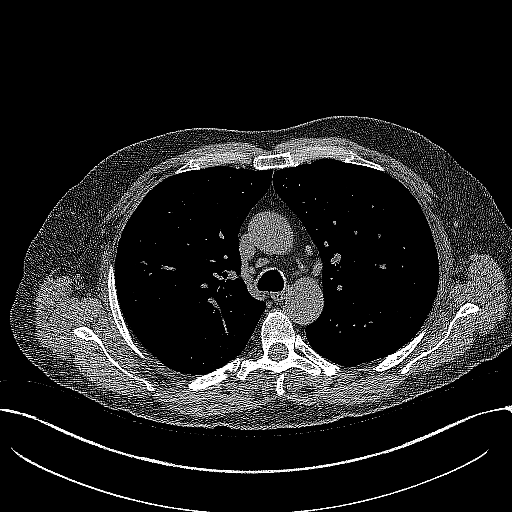
[im 255/374  lung]
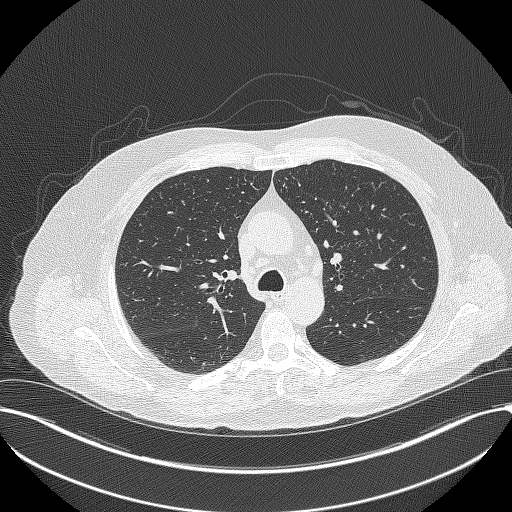
[im 289/374  lung]
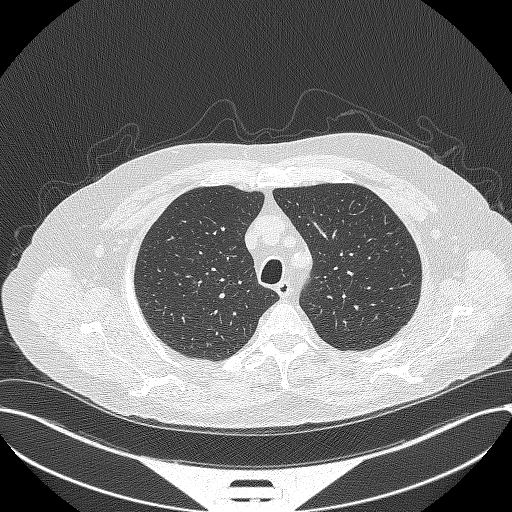
[im 323/374  lung]
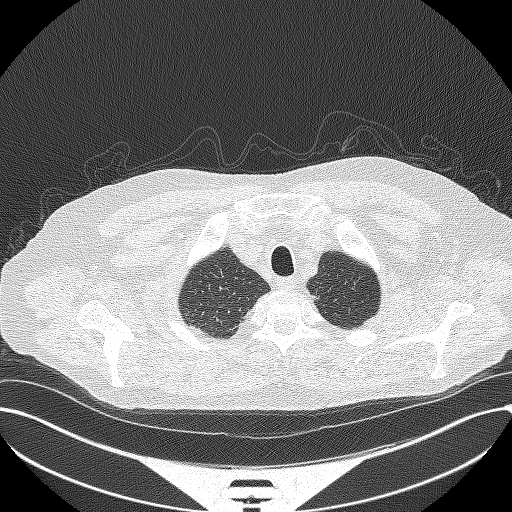
[im 357/374  lung]
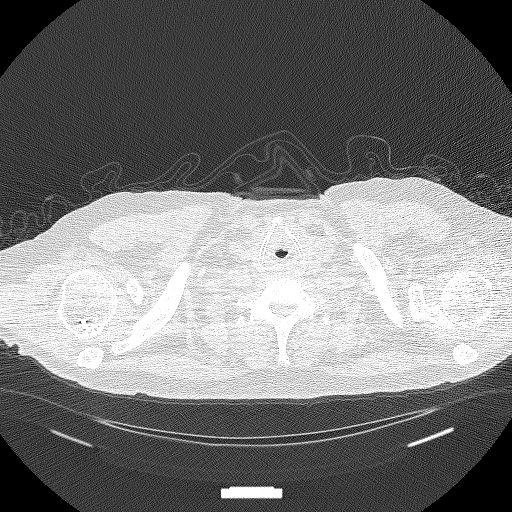

[15 of 40 positions shown; findings below may reference images not displayed]

FINDINGS: Cardiovascular: Normal heart size. No pericardial effusion. No
significant coronary artery calcifications. Atherosclerotic disease
of the thoracic aorta.

Mediastinum/Nodes: No pathologically enlarged lymph nodes seen in
the chest. Esophagus is unremarkable.

Lungs/Pleura: Central airways are patent. Mild centrilobular and
paraseptal emphysema. No consolidation, pleural effusion or
pneumothorax. Stable small solid pulmonary nodules. Reference nodule
of the left lung apex measuring 3.0 mm on image 68.

Upper Abdomen: No acute abnormality.

Musculoskeletal: No chest wall mass or suspicious bone lesions
identified.
IMPRESSION: 1. Lung-RADS 2, benign appearance or behavior. Continue annual
screening with low-dose chest CT without contrast in 12 months.
2. Aortic Atherosclerosis (7AJSX-JQM.M) and Emphysema (7AJSX-[id number].D).

## 2023-04-21 DIAGNOSIS — M5416 Radiculopathy, lumbar region: Secondary | ICD-10-CM | POA: Diagnosis not present

## 2023-05-05 DIAGNOSIS — M545 Low back pain, unspecified: Secondary | ICD-10-CM | POA: Diagnosis not present

## 2023-05-05 DIAGNOSIS — M9959 Intervertebral disc stenosis of neural canal of abdomen and other regions: Secondary | ICD-10-CM | POA: Diagnosis not present

## 2023-05-05 DIAGNOSIS — M51369 Other intervertebral disc degeneration, lumbar region without mention of lumbar back pain or lower extremity pain: Secondary | ICD-10-CM | POA: Diagnosis not present

## 2023-05-26 DIAGNOSIS — M48061 Spinal stenosis, lumbar region without neurogenic claudication: Secondary | ICD-10-CM | POA: Diagnosis not present

## 2023-05-26 DIAGNOSIS — M47816 Spondylosis without myelopathy or radiculopathy, lumbar region: Secondary | ICD-10-CM | POA: Diagnosis not present

## 2023-06-08 DIAGNOSIS — M6281 Muscle weakness (generalized): Secondary | ICD-10-CM | POA: Diagnosis not present

## 2023-06-08 DIAGNOSIS — M2569 Stiffness of other specified joint, not elsewhere classified: Secondary | ICD-10-CM | POA: Diagnosis not present

## 2023-06-08 DIAGNOSIS — M545 Low back pain, unspecified: Secondary | ICD-10-CM | POA: Diagnosis not present

## 2023-06-15 DIAGNOSIS — M545 Low back pain, unspecified: Secondary | ICD-10-CM | POA: Diagnosis not present

## 2023-06-15 DIAGNOSIS — M6281 Muscle weakness (generalized): Secondary | ICD-10-CM | POA: Diagnosis not present

## 2023-06-15 DIAGNOSIS — M2569 Stiffness of other specified joint, not elsewhere classified: Secondary | ICD-10-CM | POA: Diagnosis not present

## 2023-06-22 DIAGNOSIS — M6281 Muscle weakness (generalized): Secondary | ICD-10-CM | POA: Diagnosis not present

## 2023-06-22 DIAGNOSIS — M545 Low back pain, unspecified: Secondary | ICD-10-CM | POA: Diagnosis not present

## 2023-06-22 DIAGNOSIS — M2569 Stiffness of other specified joint, not elsewhere classified: Secondary | ICD-10-CM | POA: Diagnosis not present

## 2023-06-29 DIAGNOSIS — M545 Low back pain, unspecified: Secondary | ICD-10-CM | POA: Diagnosis not present

## 2023-06-29 DIAGNOSIS — M2569 Stiffness of other specified joint, not elsewhere classified: Secondary | ICD-10-CM | POA: Diagnosis not present

## 2023-06-29 DIAGNOSIS — M6281 Muscle weakness (generalized): Secondary | ICD-10-CM | POA: Diagnosis not present

## 2023-07-07 DIAGNOSIS — M6281 Muscle weakness (generalized): Secondary | ICD-10-CM | POA: Diagnosis not present

## 2023-07-07 DIAGNOSIS — M2569 Stiffness of other specified joint, not elsewhere classified: Secondary | ICD-10-CM | POA: Diagnosis not present

## 2023-07-07 DIAGNOSIS — M545 Low back pain, unspecified: Secondary | ICD-10-CM | POA: Diagnosis not present

## 2023-07-13 DIAGNOSIS — M2569 Stiffness of other specified joint, not elsewhere classified: Secondary | ICD-10-CM | POA: Diagnosis not present

## 2023-07-13 DIAGNOSIS — M6281 Muscle weakness (generalized): Secondary | ICD-10-CM | POA: Diagnosis not present

## 2023-07-13 DIAGNOSIS — M545 Low back pain, unspecified: Secondary | ICD-10-CM | POA: Diagnosis not present

## 2023-07-21 DIAGNOSIS — M25562 Pain in left knee: Secondary | ICD-10-CM | POA: Diagnosis not present

## 2023-07-26 DIAGNOSIS — E782 Mixed hyperlipidemia: Secondary | ICD-10-CM | POA: Diagnosis not present

## 2023-07-26 DIAGNOSIS — J449 Chronic obstructive pulmonary disease, unspecified: Secondary | ICD-10-CM | POA: Diagnosis not present

## 2023-08-25 DIAGNOSIS — M47816 Spondylosis without myelopathy or radiculopathy, lumbar region: Secondary | ICD-10-CM | POA: Diagnosis not present

## 2023-08-25 DIAGNOSIS — M48061 Spinal stenosis, lumbar region without neurogenic claudication: Secondary | ICD-10-CM | POA: Diagnosis not present

## 2023-09-01 DIAGNOSIS — M47816 Spondylosis without myelopathy or radiculopathy, lumbar region: Secondary | ICD-10-CM | POA: Diagnosis not present

## 2023-09-15 DIAGNOSIS — M47816 Spondylosis without myelopathy or radiculopathy, lumbar region: Secondary | ICD-10-CM | POA: Diagnosis not present

## 2023-09-15 DIAGNOSIS — M48061 Spinal stenosis, lumbar region without neurogenic claudication: Secondary | ICD-10-CM | POA: Diagnosis not present

## 2023-10-04 DIAGNOSIS — H5203 Hypermetropia, bilateral: Secondary | ICD-10-CM | POA: Diagnosis not present

## 2023-10-04 DIAGNOSIS — H52223 Regular astigmatism, bilateral: Secondary | ICD-10-CM | POA: Diagnosis not present

## 2023-10-04 DIAGNOSIS — H524 Presbyopia: Secondary | ICD-10-CM | POA: Diagnosis not present

## 2023-10-04 DIAGNOSIS — H43813 Vitreous degeneration, bilateral: Secondary | ICD-10-CM | POA: Diagnosis not present

## 2023-10-04 DIAGNOSIS — H2513 Age-related nuclear cataract, bilateral: Secondary | ICD-10-CM | POA: Diagnosis not present

## 2023-10-07 DIAGNOSIS — Z23 Encounter for immunization: Secondary | ICD-10-CM | POA: Diagnosis not present

## 2023-10-07 DIAGNOSIS — E782 Mixed hyperlipidemia: Secondary | ICD-10-CM | POA: Diagnosis not present

## 2023-10-07 DIAGNOSIS — Z131 Encounter for screening for diabetes mellitus: Secondary | ICD-10-CM | POA: Diagnosis not present

## 2023-10-07 DIAGNOSIS — Z0000 Encounter for general adult medical examination without abnormal findings: Secondary | ICD-10-CM | POA: Diagnosis not present

## 2023-10-07 DIAGNOSIS — J449 Chronic obstructive pulmonary disease, unspecified: Secondary | ICD-10-CM | POA: Diagnosis not present

## 2023-10-07 DIAGNOSIS — Z862 Personal history of diseases of the blood and blood-forming organs and certain disorders involving the immune mechanism: Secondary | ICD-10-CM | POA: Diagnosis not present

## 2023-11-02 DIAGNOSIS — R109 Unspecified abdominal pain: Secondary | ICD-10-CM | POA: Diagnosis not present

## 2023-11-02 DIAGNOSIS — R34 Anuria and oliguria: Secondary | ICD-10-CM | POA: Diagnosis not present

## 2024-02-01 ENCOUNTER — Ambulatory Visit
Admission: RE | Admit: 2024-02-01 | Discharge: 2024-02-01 | Disposition: A | Discharge status: Home / Self Care | Source: Ambulatory Visit | Attending: Acute Care | Admitting: Acute Care

## 2024-02-01 DIAGNOSIS — Z122 Encounter for screening for malignant neoplasm of respiratory organs: Secondary | ICD-10-CM

## 2024-02-01 DIAGNOSIS — F17210 Nicotine dependence, cigarettes, uncomplicated: Secondary | ICD-10-CM

## 2024-02-01 DIAGNOSIS — Z87891 Personal history of nicotine dependence: Secondary | ICD-10-CM

## 2024-02-08 ENCOUNTER — Other Ambulatory Visit: Payer: Self-pay

## 2024-02-08 DIAGNOSIS — Z87891 Personal history of nicotine dependence: Secondary | ICD-10-CM

## 2024-02-08 DIAGNOSIS — Z122 Encounter for screening for malignant neoplasm of respiratory organs: Secondary | ICD-10-CM
